# Patient Record
Sex: Female | Born: 1965
Health system: Southern US, Community
[De-identification: ages and names within clinical notes are randomized; demographics above are authoritative.]

## PROBLEM LIST (undated history)

## (undated) DIAGNOSIS — B029 Zoster without complications: Secondary | ICD-10-CM

## (undated) DIAGNOSIS — B019 Varicella without complication: Secondary | ICD-10-CM

## (undated) DIAGNOSIS — N950 Postmenopausal bleeding: Secondary | ICD-10-CM

## (undated) HISTORY — PX: JOINT REPLACEMENT: SHX530

## (undated) HISTORY — PX: BREAST ENHANCEMENT SURGERY: SHX7

## (undated) HISTORY — PX: AUGMENTATION MAMMAPLASTY: SUR837

## (undated) HISTORY — DX: Zoster without complications: B02.9

## (undated) HISTORY — DX: Postmenopausal bleeding: N95.0

## (undated) HISTORY — DX: Varicella without complication: B01.9

## (undated) HISTORY — PX: CHOLECYSTECTOMY: SHX55

---

## 2014-11-23 LAB — HM PAP SMEAR: HM PAP: NEGATIVE

## 2014-12-21 LAB — HM MAMMOGRAPHY

## 2014-12-28 ENCOUNTER — Ambulatory Visit (INDEPENDENT_AMBULATORY_CARE_PROVIDER_SITE_OTHER): Payer: BLUE CROSS/BLUE SHIELD | Admitting: Primary Care

## 2014-12-28 ENCOUNTER — Encounter: Payer: Self-pay | Admitting: Primary Care

## 2014-12-28 VITALS — BP 110/68 | HR 69 | Temp 98.1°F | Ht 68.0 in | Wt 129.8 lb

## 2014-12-28 DIAGNOSIS — G8929 Other chronic pain: Secondary | ICD-10-CM | POA: Insufficient documentation

## 2014-12-28 DIAGNOSIS — M25562 Pain in left knee: Secondary | ICD-10-CM | POA: Diagnosis not present

## 2014-12-28 DIAGNOSIS — M25569 Pain in unspecified knee: Secondary | ICD-10-CM

## 2014-12-28 NOTE — Assessment & Plan Note (Signed)
Intermittent starting 3 years ago. Crepitus on exam without decrease in ROM. Referral made to sports med per patient request. Continue tylenol PRN.

## 2014-12-28 NOTE — Progress Notes (Signed)
Pre visit review using our clinic review tool, if applicable. No additional management support is needed unless otherwise documented below in the visit note. 

## 2014-12-28 NOTE — Progress Notes (Signed)
Subjective:    Patient ID: Tara Parker, female    DOB: 08-14-1965, 49 y.o.   MRN: [id number]  HPI  [name] is a 49 year old female who presents today to establish care and discuss the problems mentioned below. Will obtain old records.  1) Knee pain: Pain and crepitus to left knee for the past 3 months. She's had this problem occur 3 years ago and visited a sports medicine physician in [place] who was helpful in providing exercises. She is intrersted in seeing our sports medicine physician for advice. She had xray's of her knee taken 6 weeks ago and has her results at home. She reports her xrays "looked good", but will bring them to her upcoming appointment with [name]. She takes occasional tylenol for pain with relief. She's been limiting her exercise to her local gym which has helped to reduce symptoms.   Review of Systems  Constitutional: Negative for fatigue and unexpected weight change.  HENT: Negative for rhinorrhea.   Respiratory: Negative for cough and shortness of breath.   Cardiovascular: Negative for chest pain.  Gastrointestinal: Negative for diarrhea and constipation.  Genitourinary: Negative for dysuria and frequency.  Musculoskeletal: Positive for arthralgias. Negative for myalgias.  Skin: Negative for rash.  Allergic/Immunologic: Negative for environmental allergies.  Neurological: Negative for dizziness and headaches.  Psychiatric/Behavioral:       Denies concerns for anxiety or depression.       Past Medical History  Diagnosis Date  . Chicken pox   . Shingles   . Postmenopausal vaginal bleeding     History   Social History  . Marital Status: Married    Spouse Name: N/A  . Number of Children: N/A  . Years of Education: N/A   Occupational History  . Not on file.   Social History Main Topics  . Smoking status: Never Smoker   . Smokeless tobacco: Not on file  . Alcohol Use: No  . Drug Use: Not on file  . Sexual Activity: Not on file    Other Topics Concern  . Not on file   Social History Narrative   Married.   1 child, age 35.   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   Enjoys reading, exercise, being outdoors.    Past Surgical History  Procedure Laterality Date  . Cholecystectomy      Family History  Problem Relation Age of Onset  . Heart disease Mother   . Cancer Maternal Grandmother 27    Uterine  . Heart attack Mother     Allergies  Allergen Reactions  . Penicillins     No current outpatient prescriptions on file prior to visit.   No current facility-administered medications on file prior to visit.    BP 110/68 mmHg  Pulse 69  Temp(Src) 98.1 F (36.7 C) (Oral)  Ht 5\' 8"  (1.727 m)  Wt 129 lb 12.8 oz (58.877 kg)  BMI 19.74 kg/m2  SpO2 98%  LMP 12/03/2014    Objective:   Physical Exam  Constitutional: She appears well-nourished.  Cardiovascular: Normal rate and regular rhythm.   Pulmonary/Chest: Effort normal and breath sounds normal.  Musculoskeletal:       Left knee: She exhibits normal range of motion and no swelling. No tenderness found.  Crepitus noted to left knee upon extension  Skin: Skin is warm and dry.          Assessment & Plan:

## 2014-12-28 NOTE — Patient Instructions (Signed)
You will be contacted regarding your referral to Sports Medicine.  Please let us know if you have not heard back within one week.   Please schedule a physical with me in October 2016. You will also schedule a lab only appointment one week prior. We will discuss your lab results during your physical.  It was a pleasure to meet you today! Please don't hesitate to call me with any questions. Welcome to [facility]!

## 2015-01-03 ENCOUNTER — Ambulatory Visit (INDEPENDENT_AMBULATORY_CARE_PROVIDER_SITE_OTHER): Payer: BLUE CROSS/BLUE SHIELD | Admitting: Family Medicine

## 2015-01-03 ENCOUNTER — Encounter: Payer: Self-pay | Admitting: Family Medicine

## 2015-01-03 VITALS — BP 90/60 | HR 72 | Temp 97.7°F | Ht 68.0 in | Wt 127.5 lb

## 2015-01-03 DIAGNOSIS — M25562 Pain in left knee: Secondary | ICD-10-CM

## 2015-01-03 DIAGNOSIS — M2240 Chondromalacia patellae, unspecified knee: Secondary | ICD-10-CM | POA: Diagnosis not present

## 2015-01-03 DIAGNOSIS — M25561 Pain in right knee: Secondary | ICD-10-CM

## 2015-01-03 NOTE — Progress Notes (Signed)
Pre visit review using our clinic review tool, if applicable. No additional management support is needed unless otherwise documented below in the visit note. 

## 2015-01-03 NOTE — Progress Notes (Signed)
Dr. Karleen Hampshire T. Shavona Gunderman, MD, CAQ Sports Medicine Primary Care and Sports Medicine [address] [place], [address] Phone: [phone] Fax: [phone]  01/03/2015  Patient: Tara Parker, MRN: [id number], DOB: [birth year], 49 y.o.  Primary Physician:  Morrie Sheldon, NP  Chief Complaint: Knee Pain  Subjective:   Tara Parker is a 49 y.o. very pleasant female patient who presents with the following:  Consulting Provider: Mayra Reel, NP Reason: B Knee pain  Very pleasant, lifelong active patient who is an avid weightlifter.  She is here for some advice about what she can do for her knees and what she cannot do.  She saw a provider in [place] a number of months ago, and she was told that she had chondromalacia patella.  She was given some advice, and she was told that she should no longer squat, deadlift, lunge or do any other explosive movements.  She wanted to get my advised about whether or not she could adapter workouts and start including some of these more traditional lifts.  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  She has never had any previous knee surgery.  She is not having any effusions.  She is not having any mechanical symptoms.  Some pain going up and down stairs and will be a little achy.  Left is a little worse.   Past Medical History, Surgical History, Social History, Family History, Problem List, Medications, and Allergies have been reviewed and updated if relevant.  Patient Active Problem List   Diagnosis Date Noted  . Knee pain, chronic 12/28/2014    Past Medical History  Diagnosis Date  . Chicken pox   . Shingles   . Postmenopausal vaginal bleeding     Past Surgical History  Procedure Laterality Date  . Cholecystectomy      History   Social History  . Marital Status: Married    Spouse Name: N/A  . Number of Children: N/A  . Years of Education: N/A   Occupational History  . Not on file.   Social  History Main Topics  . Smoking status: Never Smoker   . Smokeless tobacco: Never Used  . Alcohol Use: No  . Drug Use: Not on file  . Sexual Activity: Not on file   Other Topics Concern  . Not on file   Social History Narrative   Married.   1 child, age 71.   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   Enjoys reading, exercise, being outdoors.    Family History  Problem Relation Age of Onset  . Heart disease Mother   . Cancer Maternal Grandmother 27    Uterine  . Heart attack Mother     Allergies  Allergen Reactions  . Penicillins     Medication list reviewed and updated in full in [facility].  GEN: No fevers, chills. Nontoxic. Primarily MSK c/o today. MSK: Detailed in the HPI GI: tolerating PO intake without difficulty Neuro: No numbness, parasthesias, or tingling associated. Otherwise the pertinent positives of the ROS are noted above.   Objective:   BP 90/60 mmHg  Pulse 72  Temp(Src) 97.7 F (36.5 C) (Oral)  Ht 5\' 8"  (1.727 m)  Wt 127 lb 8 oz (57.834 kg)  BMI 19.39 kg/m2  LMP 12/03/2014   GEN: WDWN, NAD, Non-toxic, Alert & Oriented x 3 HEENT: Atraumatic, Normocephalic.  Ears and Nose: No external deformity. EXTR:  No clubbing/cyanosis/edema NEURO: Normal gait.  PSYCH: Normally interactive. Conversant. Not depressed or anxious appearing.  Calm demeanor.   Knee:  B Gait: Normal heel toe pattern ROM: 0-135 Effusion: neg Echymosis or edema: none Patellar tendon NT Painful PLICA: neg Patellar grind: + B Medial and lateral patellar facet loading: negative medial and lateral joint lines:NT Mcmurray's neg Flexion-pinch neg Varus and valgus stress: stable Lachman: neg Ant and Post drawer: neg Hip abduction, IR, ER: WNL Hip flexion str: 5/5 Hip abd: 5/5 Quad: 5/5 VMO atrophy:No Hamstring concentric and eccentric: 5/5   Radiology: Bilateral AP, lateral, and Rosenberg weightbearing films.  Bilateral  sunrise. Independent interpretation from outside films.  These are reproductions on paper of high quality, but original films are not available.  There are some mild changes of patellar osteophytosis present.  Joint spaces are preserved medially and laterally.  Apparently normal tracking on sunrise view. Hannah Beat, MD   Assessment and Plan:   Chondropathia patellae, unspecified laterality  Knee pain, bilateral  The patient's knees move well.  She does have some patellar crepitus.  She notices this when she is trying to squat and she is doing the deadlift, but she is not really having any pain when doing these. Leg extensions have been causing her some pain, as well as lunges.  The patient's joint space is effectively preserved with some PF changes.  She has fantastic motion for age.  Crepitus at age 49 is basically normal.  If she hears crepitus while she is doing any compound movement, this is really not all that important.  Recommended that she get some 9 mm compression knee sleeves that are used for powerlifting.  I certainly have no problem with her doing squats or deadlifts.  She may have to adjust her depth with doing squats.  Lunges may be more difficult, but I think that she can also adjust her depth, and some people are able to do a reverse lunge more easily. Recommended doing some specific VMO work, too. Try a longer warm-up.  I appreciate the opportunity to evaluate this very friendly patient. If you have any question regarding her care or prognosis, do not hesitate to ask.   Follow-up: prn  Patient Instructions  Deadlifts fine Squats fine - keep parallell - slightly above if needed  Specifics: Work on [employer] work (teardrop muscle near knee) Leg extensions with modification without taking to 90. Place small med ball - squeeze between legs to help engage VMO.       Signed,  Elpidio Galea. Ardell Aaronson, MD   Patient's Medications  New Prescriptions   No medications on file    Previous Medications   ASCORBIC ACID (VITAMIN C) 1000 MG TABLET    Take 1,000 mg by mouth daily.   ESTRADIOL (CLIMARA - DOSED IN MG/24 HR) 0.075 MG/24HR PATCH    Place 0.075 mg onto the skin 2 (two) times a week.    MULTIPLE VITAMIN (MULTIVITAMIN) CAPSULE    Take 1 capsule by mouth daily.   PROGESTERONE (PROMETRIUM) 100 MG CAPSULE    Take 100 mg by mouth. Days 14 thru 25 every month  Modified Medications   No medications on file  Discontinued Medications   No medications on file

## 2015-01-03 NOTE — Patient Instructions (Signed)
Deadlifts fine Squats fine - keep parallell - slightly above if needed  Specifics: Work on [employer] work (teardrop muscle near knee) Leg extensions with modification without taking to 90. Place small med ball - squeeze between legs to help engage VMO.

## 2015-04-17 ENCOUNTER — Other Ambulatory Visit: Payer: Self-pay | Admitting: Primary Care

## 2015-04-17 DIAGNOSIS — Z0000 Encounter for general adult medical examination without abnormal findings: Secondary | ICD-10-CM

## 2015-04-23 ENCOUNTER — Other Ambulatory Visit (INDEPENDENT_AMBULATORY_CARE_PROVIDER_SITE_OTHER): Payer: BLUE CROSS/BLUE SHIELD

## 2015-04-23 DIAGNOSIS — Z0000 Encounter for general adult medical examination without abnormal findings: Secondary | ICD-10-CM

## 2015-04-23 LAB — COMPREHENSIVE METABOLIC PANEL
ALBUMIN: 4.5 g/dL (ref 3.5–5.2)
ALK PHOS: 29 U/L — AB (ref 39–117)
ALT: 20 U/L (ref 0–35)
AST: 26 U/L (ref 0–37)
BUN: 16 mg/dL (ref 6–23)
CALCIUM: 9.8 mg/dL (ref 8.4–10.5)
CO2: 30 mEq/L (ref 19–32)
Chloride: 103 mEq/L (ref 96–112)
Creatinine, Ser: 0.89 mg/dL (ref 0.40–1.20)
GFR: 71.57 mL/min (ref >60.00)
Glucose, Bld: 88 mg/dL (ref 70–99)
Potassium: 4.2 mEq/L (ref 3.5–5.1)
Sodium: 142 mEq/L (ref 135–145)
TOTAL PROTEIN: 7.1 g/dL (ref 6.0–8.3)
Total Bilirubin: 0.9 mg/dL (ref 0.2–1.2)

## 2015-04-23 LAB — LIPID PANEL
CHOLESTEROL: 174 mg/dL (ref 0–200)
HDL: 62.2 mg/dL (ref >39.00)
LDL Cholesterol: 101 mg/dL — ABNORMAL HIGH (ref 0–99)
NonHDL: 111.62
TRIGLYCERIDES: 55 mg/dL (ref 0.0–149.0)
Total CHOL/HDL Ratio: 3
VLDL: 11 mg/dL (ref 0.0–40.0)

## 2015-04-23 LAB — VITAMIN D 25 HYDROXY (VIT D DEFICIENCY, FRACTURES): VITD: 40.7 ng/mL (ref 30.00–100.00)

## 2015-04-25 ENCOUNTER — Encounter: Payer: BLUE CROSS/BLUE SHIELD | Admitting: Primary Care

## 2015-05-04 ENCOUNTER — Ambulatory Visit (INDEPENDENT_AMBULATORY_CARE_PROVIDER_SITE_OTHER): Payer: BLUE CROSS/BLUE SHIELD | Admitting: Primary Care

## 2015-05-04 ENCOUNTER — Encounter: Payer: Self-pay | Admitting: Primary Care

## 2015-05-04 VITALS — BP 92/64 | HR 70 | Temp 98.2°F | Ht 68.0 in | Wt 126.8 lb

## 2015-05-04 DIAGNOSIS — L989 Disorder of the skin and subcutaneous tissue, unspecified: Secondary | ICD-10-CM | POA: Diagnosis not present

## 2015-05-04 DIAGNOSIS — Z23 Encounter for immunization: Secondary | ICD-10-CM

## 2015-05-04 DIAGNOSIS — R238 Other skin changes: Secondary | ICD-10-CM

## 2015-05-04 DIAGNOSIS — Z0000 Encounter for general adult medical examination without abnormal findings: Secondary | ICD-10-CM

## 2015-05-04 NOTE — Assessment & Plan Note (Signed)
Tetanus, pap, and mammogram UTD, flu provided today. Exam and labs unremarkable. She leads a healthy lifestyle through healthy diet and exercise. History of shingles, she will check on insurance coverage for the vaccination. Referral placed to dermatology for annual body checks as she was getting this in [place] Follow up in 1 year for repeat physical.

## 2015-05-04 NOTE — Patient Instructions (Addendum)
Please contact your insurance company regarding coverage for the Zostavax (shingles) vaccination.  Continue your efforts towards a healthy lifestyle.  You will be contacted regarding your referral to Dermatology.  Please let us know if you have not heard back within one week.   Follow up in 1 year for repeat physical.   It was a pleasure to see you today!

## 2015-05-04 NOTE — Progress Notes (Signed)
Pre visit review using our clinic review tool, if applicable. No additional management support is needed unless otherwise documented below in the visit note. 

## 2015-05-04 NOTE — Progress Notes (Signed)
Subjective:    Patient ID: Tara Parker, female    DOB: [birth year], 49 y.o.   MRN: [id number]  HPI  Tara Parker is a 49 year old female who presents today for complete physical.  Immunizations: -Tetanus: Completed 8-9 years ago.  -Influenza: Due, will administer today.   Diet: Endorses healthy diet. Breakfast: Omlette, waffles, oatmeal Lunch: Lean meat, veggies, sweet potatoes Dinner: Chief Operating Officer, veggie, starch Snack: Yogurt, protein shake Desserts: Occasionally ice cream Beverages: Water, coffee, un-sweet tea, crystal light Exercise: She exercises 4 times weekly at the gym for about 1 hour. Eye exam: Completed 1 year ago, due this year. Dental exam: Completes every 6 months. Pap Smear: Completed in 2016 per GYN. Mammogram: Mammogram completed in 2016.   Review of Systems  Constitutional: Negative for unexpected weight change.  HENT: Negative for rhinorrhea.   Respiratory: Negative for cough and shortness of breath.   Cardiovascular: Negative for chest pain.  Gastrointestinal: Negative for diarrhea and constipation.  Genitourinary: Negative for difficulty urinating.       Last period 5 years ago.  Musculoskeletal: Negative for myalgias and arthralgias.  Skin: Negative for rash.       She is requesting dermatologist referral.  Neurological: Negative for dizziness, numbness and headaches.  Psychiatric/Behavioral:       Denies concerns for anxiety or depression       Past Medical History  Diagnosis Date  . Chicken pox   . Shingles   . Postmenopausal vaginal bleeding     Social History   Social History  . Marital Status: Married    Spouse Name: N/A  . Number of Children: N/A  . Years of Education: N/A   Occupational History  . Not on file.   Social History Main Topics  . Smoking status: Never Smoker   . Smokeless tobacco: Never Used  . Alcohol Use: No  . Drug Use: Not on file  . Sexual Activity: Not on file   Other Topics Concern  . Not on file    Social History Narrative   Married.   1 child, age 55.   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   Enjoys reading, exercise, being outdoors.    Past Surgical History  Procedure Laterality Date  . Cholecystectomy      Family History  Problem Relation Age of Onset  . Heart disease Mother   . Cancer Maternal Grandmother 27    Uterine  . Heart attack Mother     Allergies  Allergen Reactions  . Penicillins     Current Outpatient Prescriptions on File Prior to Visit  Medication Sig Dispense Refill  . Ascorbic Acid (VITAMIN C) 1000 MG tablet Take 1,000 mg by mouth daily.    Marland Kitchen estradiol (CLIMARA - DOSED IN MG/24 HR) 0.075 mg/24hr patch Place 0.075 mg onto the skin 2 (two) times a week.     . Multiple Vitamin (MULTIVITAMIN) capsule Take 1 capsule by mouth daily.    . progesterone (PROMETRIUM) 100 MG capsule Take 100 mg by mouth. Days 14 thru 25 every month     No current facility-administered medications on file prior to visit.    BP 92/64 mmHg  Pulse 70  Temp(Src) 98.2 F (36.8 C) (Oral)  Ht  (1.727 m)  Wt 126 lb 12.8 oz (57.516 kg)  BMI 19.28 kg/m2  SpO2 97%  LMP 12/03/2014    Objective:   Physical Exam  Constitutional:  She is oriented to person, place, and time. She appears well-nourished.  HENT:  Right Ear: Tympanic membrane and ear canal normal.  Left Ear: Tympanic membrane and ear canal normal.  Nose: Nose normal.  Mouth/Throat: Oropharynx is clear and moist.  Eyes: Conjunctivae and EOM are normal. Pupils are equal, round, and reactive to light.  Neck: Neck supple. No thyromegaly present.  Cardiovascular: Normal rate and regular rhythm.   Pulmonary/Chest: Effort normal and breath sounds normal.  Abdominal: Soft. Bowel sounds are normal. There is no tenderness.  Musculoskeletal: Normal range of motion.  Lymphadenopathy:    She has no cervical adenopathy.  Neurological: She is alert and oriented to  person, place, and time. She has normal reflexes. No cranial nerve deficit.  Skin: Skin is warm and dry.  Psychiatric: She has a normal mood and affect.          Assessment & Plan:

## 2015-05-04 NOTE — Addendum Note (Signed)
Addended by: Tawnya CrookSAMBATH, Rondale Nies on: 05/04/2015 11:01 AM   Modules accepted: Orders

## 2016-01-03 DIAGNOSIS — N951 Menopausal and female climacteric states: Secondary | ICD-10-CM | POA: Diagnosis not present

## 2016-01-03 DIAGNOSIS — Z01419 Encounter for gynecological examination (general) (routine) without abnormal findings: Secondary | ICD-10-CM | POA: Diagnosis not present

## 2016-01-03 DIAGNOSIS — Z6821 Body mass index (BMI) 21.0-21.9, adult: Secondary | ICD-10-CM | POA: Diagnosis not present

## 2016-04-03 DIAGNOSIS — M62830 Muscle spasm of back: Secondary | ICD-10-CM | POA: Diagnosis not present

## 2016-04-03 DIAGNOSIS — M9902 Segmental and somatic dysfunction of thoracic region: Secondary | ICD-10-CM | POA: Diagnosis not present

## 2016-04-03 DIAGNOSIS — R51 Headache: Secondary | ICD-10-CM | POA: Diagnosis not present

## 2016-04-03 DIAGNOSIS — M9901 Segmental and somatic dysfunction of cervical region: Secondary | ICD-10-CM | POA: Diagnosis not present

## 2016-06-10 ENCOUNTER — Encounter: Payer: Self-pay | Admitting: Primary Care

## 2016-06-10 ENCOUNTER — Ambulatory Visit (INDEPENDENT_AMBULATORY_CARE_PROVIDER_SITE_OTHER): Payer: BLUE CROSS/BLUE SHIELD | Admitting: Primary Care

## 2016-06-10 VITALS — BP 108/70 | HR 73 | Temp 98.0°F | Ht 68.0 in | Wt 129.0 lb

## 2016-06-10 DIAGNOSIS — R21 Rash and other nonspecific skin eruption: Secondary | ICD-10-CM | POA: Diagnosis not present

## 2016-06-10 DIAGNOSIS — Z23 Encounter for immunization: Secondary | ICD-10-CM | POA: Diagnosis not present

## 2016-06-10 NOTE — Progress Notes (Signed)
Pre visit review using our clinic review tool, if applicable. No additional management support is needed unless otherwise documented below in the visit note. 

## 2016-06-10 NOTE — Patient Instructions (Signed)
Stop taking the antihistamine to see if the rash flares again.   If the rash flares again, then start taking the antihistamine every day for the next 4 weeks.  Please e-mail me through my chart if no improvement at that time.  It was a pleasure to see you today!

## 2016-06-10 NOTE — Progress Notes (Signed)
Subjective:    Patient ID: Tara Parker, female    DOB: Dec 15, 1965, 50 y.o.   MRN: [id number]  HPI  Tara Parker is a 50 year old female who presents today with a chief complaint of rash. Her rash is located to the posterior trunk that has been intermittent since late September 2017. Tara Parker has noticed the rash to her bilateral upper extremities as well.   Tara Parker has has been under increased stress as Tara Parker's lost a job and there's been a death in the family. Tara Parker denies changes in detergents, soaps, food, medications. Tara Parker also denies shortness of breath, tightness to her chest and throat, wheezing. Tara Parker's been taking an OTC antihistamine twice daily intermittently starting at the end of October with temporary improvement. Her last flare was October 31st, but Tara Parker started noticing signs of a flare today.  Review of Systems  Respiratory: Negative for chest tightness and shortness of breath.   Skin: Positive for rash.  Allergic/Immunologic: Negative for environmental allergies and food allergies.       Past Medical History:  Diagnosis Date  . Chicken pox   . Postmenopausal vaginal bleeding   . Shingles      Social History   Social History  . Marital status: Married    Spouse name: N/A  . Number of children: N/A  . Years of education: N/A   Occupational History  . Not on file.   Social History Main Topics  . Smoking status: Never Smoker  . Smokeless tobacco: Never Used  . Alcohol use No  . Drug use: Unknown  . Sexual activity: Not on file   Other Topics Concern  . Not on file   Social History Narrative   Married.   1 child, age 50.   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   Enjoys reading, exercise, being outdoors.    Past Surgical History:  Procedure Laterality Date  . CHOLECYSTECTOMY      Family History  Problem Relation Age of Onset  . Heart disease Mother   . Cancer Maternal Grandmother 27    Uterine  . Heart  attack Mother     Allergies  Allergen Reactions  . Penicillins     Current Outpatient Prescriptions on File Prior to Visit  Medication Sig Dispense Refill  . Ascorbic Acid (VITAMIN C) 1000 MG tablet Take 1,000 mg by mouth daily.    . Multiple Vitamin (MULTIVITAMIN) capsule Take 1 capsule by mouth daily.    . progesterone (PROMETRIUM) 100 MG capsule Take 100 mg by mouth. Days 14 thru 25 every month     No current facility-administered medications on file prior to visit.     BP 108/70   Pulse 73   Temp 98 F (36.7 C)   Ht 5\' 8"  (1.727 m)   Wt 129 lb (58.5 kg)   LMP 12/03/2014   SpO2 98%   BMI 19.61 kg/m    Objective:   Physical Exam  Constitutional: Tara Parker appears well-nourished.  Neck: Neck supple.  Cardiovascular: Normal rate and regular rhythm.   Pulmonary/Chest: Effort normal and breath sounds normal.  Skin: Skin is warm and dry.  Very mild, small, flat area of erythema to left anterior upper forearm distal to antecubital fold.          Assessment & Plan:  Rash:  Intermittently present since late September. Tara Parker brings a picture today with the full blown rash to her posterior  trunk which appears to be macular rash with moderate erythema.  Unsure of etiology, but suspect this is stress induced. Does not appear to be contact dermatitis. Will have her start the antihistamine daily for the next 4 weeks. If no improvement in 1-2 weeks then will add in famotidine 20 mg once daily. Discussed stress reduction techniques.  Tara Parker,Tara Lassalle Kendal, NP

## 2016-06-24 ENCOUNTER — Encounter: Payer: Self-pay | Admitting: Primary Care

## 2016-07-24 DIAGNOSIS — D0439 Carcinoma in situ of skin of other parts of face: Secondary | ICD-10-CM | POA: Diagnosis not present

## 2016-07-24 DIAGNOSIS — D1801 Hemangioma of skin and subcutaneous tissue: Secondary | ICD-10-CM | POA: Diagnosis not present

## 2016-07-24 DIAGNOSIS — D485 Neoplasm of uncertain behavior of skin: Secondary | ICD-10-CM | POA: Diagnosis not present

## 2016-07-24 DIAGNOSIS — C44612 Basal cell carcinoma of skin of right upper limb, including shoulder: Secondary | ICD-10-CM | POA: Diagnosis not present

## 2016-08-14 DIAGNOSIS — L578 Other skin changes due to chronic exposure to nonionizing radiation: Secondary | ICD-10-CM | POA: Diagnosis not present

## 2016-08-14 DIAGNOSIS — L908 Other atrophic disorders of skin: Secondary | ICD-10-CM | POA: Diagnosis not present

## 2016-08-14 DIAGNOSIS — L814 Other melanin hyperpigmentation: Secondary | ICD-10-CM | POA: Diagnosis not present

## 2016-08-14 DIAGNOSIS — D0439 Carcinoma in situ of skin of other parts of face: Secondary | ICD-10-CM | POA: Diagnosis not present

## 2016-08-21 DIAGNOSIS — M9901 Segmental and somatic dysfunction of cervical region: Secondary | ICD-10-CM | POA: Diagnosis not present

## 2016-08-21 DIAGNOSIS — M9902 Segmental and somatic dysfunction of thoracic region: Secondary | ICD-10-CM | POA: Diagnosis not present

## 2016-08-21 DIAGNOSIS — M62830 Muscle spasm of back: Secondary | ICD-10-CM | POA: Diagnosis not present

## 2016-08-21 DIAGNOSIS — R51 Headache: Secondary | ICD-10-CM | POA: Diagnosis not present

## 2016-08-22 DIAGNOSIS — H52203 Unspecified astigmatism, bilateral: Secondary | ICD-10-CM | POA: Diagnosis not present

## 2016-08-22 DIAGNOSIS — H2513 Age-related nuclear cataract, bilateral: Secondary | ICD-10-CM | POA: Diagnosis not present

## 2016-08-22 DIAGNOSIS — H524 Presbyopia: Secondary | ICD-10-CM | POA: Diagnosis not present

## 2016-08-22 DIAGNOSIS — H5203 Hypermetropia, bilateral: Secondary | ICD-10-CM | POA: Diagnosis not present

## 2016-09-05 DIAGNOSIS — C44519 Basal cell carcinoma of skin of other part of trunk: Secondary | ICD-10-CM | POA: Diagnosis not present

## 2016-09-05 DIAGNOSIS — C44612 Basal cell carcinoma of skin of right upper limb, including shoulder: Secondary | ICD-10-CM | POA: Diagnosis not present

## 2016-10-19 LAB — HM MAMMOGRAPHY

## 2016-10-24 ENCOUNTER — Encounter: Payer: Self-pay | Admitting: Primary Care

## 2016-10-29 ENCOUNTER — Encounter: Payer: Self-pay | Admitting: Family Medicine

## 2016-10-29 ENCOUNTER — Ambulatory Visit (INDEPENDENT_AMBULATORY_CARE_PROVIDER_SITE_OTHER): Payer: BLUE CROSS/BLUE SHIELD | Admitting: Family Medicine

## 2016-10-29 VITALS — BP 98/68 | HR 75 | Temp 98.5°F | Wt 130.0 lb

## 2016-10-29 DIAGNOSIS — N3090 Cystitis, unspecified without hematuria: Secondary | ICD-10-CM | POA: Diagnosis not present

## 2016-10-29 DIAGNOSIS — R300 Dysuria: Secondary | ICD-10-CM

## 2016-10-29 LAB — POCT URINALYSIS DIPSTICK
Bilirubin, UA: NEGATIVE
GLUCOSE UA: NEGATIVE
Ketones, UA: NEGATIVE
NITRITE UA: NEGATIVE
PROTEIN UA: NEGATIVE
Spec Grav, UA: 1.01 (ref 1.010–1.025)
Urobilinogen, UA: 0.2 E.U./dL
pH, UA: 6.5 (ref 5.0–8.0)

## 2016-10-29 MED ORDER — NITROFURANTOIN MONOHYD MACRO 100 MG PO CAPS: 100.0000 mg | ORAL_CAPSULE | Freq: Two times a day (BID) | ORAL | 0 refills | Status: DC

## 2016-10-29 NOTE — Progress Notes (Signed)
Pre visit review using our clinic review tool, if applicable. No additional management support is needed unless otherwise documented below in the visit note. 

## 2016-10-29 NOTE — Patient Instructions (Signed)

## 2016-10-29 NOTE — Progress Notes (Signed)
Subjective:    Patient ID: Tara Parker, female    DOB: May 17, 1966, 51 y.o.   MRN: [id number]  HPI This is a 51 yo female who presents today with 2 days of dysuria, increased frequency, sensation of incomplete voiding. A little lower abdominal pressure, mild low back pain. Felt a little chilly last night, resolved spontaneously. No shaking chills. No nausea or vomiting. Last week did three spin classes which is more than she typically does. Did stay in work out clothes for several hours. Last UTI was when she was in her 49s. She is postmenopausal, takes estrogen and progesterone. Good water intake.    Past Medical History:  Diagnosis Date  . Chicken pox   . Postmenopausal vaginal bleeding   . Shingles    Past Surgical History:  Procedure Laterality Date  . CHOLECYSTECTOMY     Family History  Problem Relation Age of Onset  . Heart disease Mother   . Cancer Maternal Grandmother 27    Uterine  . Heart attack Mother    Social History  Substance Use Topics  . Smoking status: Never Smoker  . Smokeless tobacco: Never Used  . Alcohol use No      Review of Systems Per HPI    Objective:   Physical Exam  Constitutional: She is oriented to person, place, and time. She appears well-developed and well-nourished. No distress.  HENT:  Head: Normocephalic and atraumatic.  Eyes: Conjunctivae are normal.  Cardiovascular: Normal rate, regular rhythm and normal heart sounds.   Pulmonary/Chest: Effort normal and breath sounds normal.  Abdominal: Soft. Bowel sounds are normal. She exhibits no distension. There is tenderness in the suprapubic area. There is no rebound, no guarding and no CVA tenderness.  Neurological: She is alert and oriented to person, place, and time.  Skin: Skin is warm and dry. She is not diaphoretic.  Psychiatric: She has a normal mood and affect. Her behavior is normal. Judgment and thought content normal.  Vitals reviewed.     BP 98/68 (BP Location: Left  Arm, Patient Position: Sitting, Cuff Size: Normal)   Pulse 75   Temp 98.5 F (36.9 C) (Oral)   Wt 130 lb (59 kg)   LMP 12/03/2014   SpO2 98%   BMI 19.77 kg/m  Wt Readings from Last 3 Encounters:  10/29/16 130 lb (59 kg)  06/10/16 129 lb (58.5 kg)  05/04/15 126 lb 12.8 oz (57.5 kg)   Results for orders placed or performed in visit on 10/29/16  POCT urinalysis dipstick  Result Value Ref Range   Color, UA yellow    Clarity, UA clear    Glucose, UA neg    Bilirubin, UA neg    Ketones, UA neg    Spec Grav, UA 1.010 1.010 - 1.025   Blood, UA 10 Ery/uL    pH, UA 6.5 5.0 - 8.0   Protein, UA neg    Urobilinogen, UA 0.2 0.2 or 1.0 E.U./dL   Nitrite, UA neg    Leukocytes, UA small (1+) small (1+)       Assessment & Plan:  1. Dysuria - POCT urinalysis dipstick  2. Cystitis - Provided written and verbal information regarding diagnosis and treatment. - RTC precautions reviewed - nitrofurantoin, macrocrystal-monohydrate, (MACROBID) 100 MG capsule; Take 1 capsule (100 mg total) by mouth 2 (two) times daily.  Dispense: 14 capsule; Refill: 0 - Urine culture   Olean Ree, FNP-BC  [facility]  10/29/2016  11:23 AM

## 2016-10-31 LAB — URINE CULTURE

## 2016-11-26 DIAGNOSIS — L578 Other skin changes due to chronic exposure to nonionizing radiation: Secondary | ICD-10-CM | POA: Diagnosis not present

## 2016-11-26 DIAGNOSIS — D0439 Carcinoma in situ of skin of other parts of face: Secondary | ICD-10-CM | POA: Diagnosis not present

## 2017-01-02 DIAGNOSIS — Z85828 Personal history of other malignant neoplasm of skin: Secondary | ICD-10-CM | POA: Diagnosis not present

## 2017-02-02 ENCOUNTER — Other Ambulatory Visit: Payer: Self-pay | Admitting: Primary Care

## 2017-02-02 DIAGNOSIS — Z0000 Encounter for general adult medical examination without abnormal findings: Secondary | ICD-10-CM

## 2017-02-06 ENCOUNTER — Other Ambulatory Visit (INDEPENDENT_AMBULATORY_CARE_PROVIDER_SITE_OTHER): Payer: BLUE CROSS/BLUE SHIELD

## 2017-02-06 ENCOUNTER — Other Ambulatory Visit: Payer: Self-pay | Admitting: Primary Care

## 2017-02-06 DIAGNOSIS — Z0000 Encounter for general adult medical examination without abnormal findings: Secondary | ICD-10-CM | POA: Diagnosis not present

## 2017-02-06 LAB — COMPREHENSIVE METABOLIC PANEL
ALT: 25 U/L (ref 0–35)
AST: 31 U/L (ref 0–37)
Albumin: 4.8 g/dL (ref 3.5–5.2)
Alkaline Phosphatase: 27 U/L — ABNORMAL LOW (ref 39–117)
BUN: 23 mg/dL (ref 6–23)
CHLORIDE: 102 meq/L (ref 96–112)
CO2: 32 meq/L (ref 19–32)
CREATININE: 0.98 mg/dL (ref 0.40–1.20)
Calcium: 9.8 mg/dL (ref 8.4–10.5)
GFR: 63.58 mL/min (ref >60.00)
GLUCOSE: 94 mg/dL (ref 70–99)
POTASSIUM: 4 meq/L (ref 3.5–5.1)
SODIUM: 139 meq/L (ref 135–145)
Total Bilirubin: 1.1 mg/dL (ref 0.2–1.2)
Total Protein: 7.1 g/dL (ref 6.0–8.3)

## 2017-02-06 LAB — LIPID PANEL
Cholesterol: 174 mg/dL (ref 0–200)
HDL: 58.9 mg/dL (ref >39.00)
LDL Cholesterol: 105 mg/dL — ABNORMAL HIGH (ref 0–99)
NonHDL: 115
Total CHOL/HDL Ratio: 3
Triglycerides: 52 mg/dL (ref 0.0–149.0)
VLDL: 10.4 mg/dL (ref 0.0–40.0)

## 2017-02-06 NOTE — Telephone Encounter (Signed)
Please notify patient that we will discuss this during her upcoming visit.

## 2017-02-06 NOTE — Telephone Encounter (Signed)
When pt came in for labs, she asked about a refill for Estradiol 1mg . [place] and she says she can't get a refill from them unless she goes back for a visit.

## 2017-02-09 NOTE — Telephone Encounter (Signed)
Message left for patient to return my call.  

## 2017-02-10 ENCOUNTER — Encounter: Payer: Self-pay | Admitting: Primary Care

## 2017-02-10 NOTE — Telephone Encounter (Deleted)
Spoken and notified patient of Kate's comments. Patient verbalized understanding. 

## 2017-02-10 NOTE — Telephone Encounter (Signed)
Message left for patient to return my call.  

## 2017-02-13 ENCOUNTER — Other Ambulatory Visit: Payer: Self-pay | Admitting: Primary Care

## 2017-02-13 ENCOUNTER — Encounter: Payer: Self-pay | Admitting: Primary Care

## 2017-02-13 ENCOUNTER — Ambulatory Visit (INDEPENDENT_AMBULATORY_CARE_PROVIDER_SITE_OTHER): Payer: BLUE CROSS/BLUE SHIELD | Admitting: Primary Care

## 2017-02-13 VITALS — Ht 68.0 in | Wt 130.4 lb

## 2017-02-13 DIAGNOSIS — G479 Sleep disorder, unspecified: Secondary | ICD-10-CM | POA: Diagnosis not present

## 2017-02-13 DIAGNOSIS — Z79890 Hormone replacement therapy (postmenopausal): Secondary | ICD-10-CM

## 2017-02-13 DIAGNOSIS — Z23 Encounter for immunization: Secondary | ICD-10-CM

## 2017-02-13 DIAGNOSIS — Z1211 Encounter for screening for malignant neoplasm of colon: Secondary | ICD-10-CM

## 2017-02-13 DIAGNOSIS — Z0000 Encounter for general adult medical examination without abnormal findings: Secondary | ICD-10-CM | POA: Diagnosis not present

## 2017-02-13 MED ORDER — PROGESTERONE MICRONIZED 100 MG PO CAPS: ORAL_CAPSULE | ORAL | 1 refills | Status: DC

## 2017-02-13 NOTE — Patient Instructions (Addendum)
You will be contacted regarding your referral to GI for the colonoscopy, pulmonology for sleep study, GYN for hormone replacement.  Please let us know if you have not heard back within one week.   I sent a refill of your Prometrium to the pharmacy. Please discuss hormone replacement with GYN.  Continue exercising. You should be getting 150 minutes of moderate intensity exercise weekly.  You were provided with a tetanus vaccination which will cover you for 10 years.  Follow up in 1 year for your annual exam or sooner if needed.  It was a pleasure to see you today!

## 2017-02-13 NOTE — Assessment & Plan Note (Signed)
TD due, provided today. Mammogram UTD, Pap UTD. Referral placed to GYN for management of hormones. Colonoscopy due, referral placed. Referral placed to pulmonology for apnea during sleep. Commended her on her healthy diet and regular exercise. Exam unremarkable. Labs unremarkable. Follow up in 1 year for annual exam.

## 2017-02-13 NOTE — Telephone Encounter (Signed)
This was addressed in office visit on 02/13/2017

## 2017-02-13 NOTE — Addendum Note (Signed)
Addended by: Tawnya CrookSAMBATH, Derreon Consalvo on: 02/13/2017 10:23 AM   Modules accepted: Orders

## 2017-02-13 NOTE — Progress Notes (Signed)
Subjective:    Patient ID: Tara Parker, female    DOB: 08-17-1965, 51 y.o.   MRN: [id number]  HPI  Tara Parker is a 51 year old female who presents today for complete physical.  Immunizations: -Tetanus: Due.  -Influenza: Completed last season   Diet: She endorses healthy diet. Breakfast: Omelette, oatmeal Lunch: Meat, vegetables Dinner: Meat, vegetable Snacks: Protein shake, protein bar, fruit Desserts: Once weekly Beverages: Coffee, water, occasional crystal, social alcohol   Exercise: Exercises with cardio three mornings weekly (20-30) minutes. Eye exam: Completed in 2018 Dental exam: Completes annually Colonoscopy: Due.  Pap Smear: Completed in 2016 per GYN.  Mammogram: Completed in April 2018   Review of Systems  Constitutional: Negative for unexpected weight change.  HENT: Negative for rhinorrhea.   Respiratory: Negative for cough and shortness of breath.        Husband has noticed patient stops breathing during the night. She will wake 1-2 times weekly gasping for air. No snoring.  Cardiovascular: Negative for chest pain.  Gastrointestinal: Negative for constipation and diarrhea.  Genitourinary: Negative for difficulty urinating and menstrual problem.  Musculoskeletal: Negative for arthralgias and myalgias.  Skin: Negative for rash.  Allergic/Immunologic: Negative for environmental allergies.  Neurological: Negative for dizziness, numbness and headaches.  Psychiatric/Behavioral:       Denies concerns for anxiety or depression       Past Medical History:  Diagnosis Date  . Chicken pox   . Postmenopausal vaginal bleeding   . Shingles      Social History   Social History  . Marital status: Married    Spouse name: N/A  . Number of children: N/A  . Years of education: N/A   Occupational History  . Not on file.   Social History Main Topics  . Smoking status: Never Smoker  . Smokeless tobacco: Never Used  . Alcohol use No  . Drug use: Unknown    . Sexual activity: Not on file   Other Topics Concern  . Not on file   Social History Narrative   Married.   1 child, age 226.   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   Enjoys reading, exercise, being outdoors.    Past Surgical History:  Procedure Laterality Date  . CHOLECYSTECTOMY      Family History  Problem Relation Age of Onset  . Heart disease Mother   . Cancer Maternal Grandmother 27       Uterine  . Heart attack Mother     Allergies  Allergen Reactions  . Penicillins     Current Outpatient Prescriptions on File Prior to Visit  Medication Sig Dispense Refill  . Ascorbic Acid (VITAMIN C) 1000 MG tablet Take 1,000 mg by mouth daily.    Marland Kitchen. estradiol (ESTRACE) 1 MG tablet TAKE 1 T BY MOUTH DAILY AS DIRECTED  3  . fluorouracil (EFUDEX) 5 % cream Apply topically daily.   0  . Multiple Vitamin (MULTIVITAMIN) capsule Take 1 capsule by mouth daily.    . progesterone (PROMETRIUM) 100 MG capsule Take 100 mg by mouth. Days 14 thru 25 every month     No current facility-administered medications on file prior to visit.     Ht 5\' 8"  (1.727 m)   Wt 130 lb 6.4 oz (59.1 kg)   LMP 12/03/2014   BMI 19.83 kg/m    Objective:   Physical Exam  Constitutional: She is oriented to person, place,  and time. She appears well-nourished.  HENT:  Right Ear: Tympanic membrane and ear canal normal.  Left Ear: Tympanic membrane and ear canal normal.  Nose: Nose normal.  Mouth/Throat: Oropharynx is clear and moist.  Eyes: Pupils are equal, round, and reactive to light. Conjunctivae and EOM are normal.  Neck: Neck supple. No thyromegaly present.  Cardiovascular: Normal rate and regular rhythm.   No murmur heard. Pulmonary/Chest: Effort normal and breath sounds normal. She has no rales.  Abdominal: Soft. Bowel sounds are normal. There is no tenderness.  Musculoskeletal: Normal range of motion.  Lymphadenopathy:    She has no cervical  adenopathy.  Neurological: She is alert and oriented to person, place, and time. She has normal reflexes. No cranial nerve deficit.  Skin: Skin is warm and dry. No rash noted.  Psychiatric: She has a normal mood and affect.          Assessment & Plan:

## 2017-03-04 DIAGNOSIS — Z85828 Personal history of other malignant neoplasm of skin: Secondary | ICD-10-CM | POA: Diagnosis not present

## 2017-03-04 DIAGNOSIS — L578 Other skin changes due to chronic exposure to nonionizing radiation: Secondary | ICD-10-CM | POA: Diagnosis not present

## 2017-03-04 DIAGNOSIS — L718 Other rosacea: Secondary | ICD-10-CM | POA: Diagnosis not present

## 2017-03-16 ENCOUNTER — Encounter: Payer: Self-pay | Admitting: Primary Care

## 2017-03-27 ENCOUNTER — Ambulatory Visit (INDEPENDENT_AMBULATORY_CARE_PROVIDER_SITE_OTHER): Payer: BLUE CROSS/BLUE SHIELD | Admitting: Internal Medicine

## 2017-03-27 VITALS — BP 100/54 | HR 80 | Resp 16 | Ht 68.0 in | Wt 129.0 lb

## 2017-03-27 DIAGNOSIS — R0683 Snoring: Secondary | ICD-10-CM

## 2017-03-27 DIAGNOSIS — G4719 Other hypersomnia: Secondary | ICD-10-CM

## 2017-03-27 NOTE — Patient Instructions (Addendum)
--  Follow up in 3 months if the test is positive and you start CPAP. If your test is negative you may cancel your follow up appt.  --If your test is negative you can use a commercial anti-snoring device.

## 2017-03-27 NOTE — Progress Notes (Signed)
[facility]      Assessment and Plan:  The patient is a 51 year old female with symptoms and signs of obstructive sleep apnea. Loud snoring, witnessed apneas. -We'll send for sleep study. Discussed different options for treatment of sleep apnea if her sleep study is positive, including a dental device versus CPAP. -Discussed that if her sleep study is negative, she can use a commercially available anti-snoring device.   Date: 03/27/2017  MRN# [id number] Tara BirkenheadKathy Parker August 27, 1965    Tara BirkenheadKathy Parker is a 51 y.o. old female seen in consultation for chief complaint of:    Chief Complaint  Patient presents with  . Advice Only    Referred by Vernona RiegerKatherine Clark  . sleep consult    Patient spouse has witnessed apnea during sleep. Patient mentioned during visit with pcp. Pt does snore per spouse at times.    HPI:   Her husband has been waking her up at night because she has not been breathing. She sleeps on her side and uses a pillow to prop herself up with a pillow.  She has never been tested in the past, she is not very sleepy during the day.    PMHX:   Past Medical History:  Diagnosis Date  . Chicken pox   . Postmenopausal vaginal bleeding   . Shingles    Surgical Hx:  Past Surgical History:  Procedure Laterality Date  . CHOLECYSTECTOMY     Family Hx:  Family History  Problem Relation Age of Onset  . Heart disease Mother   . Cancer Maternal Grandmother 27       Uterine  . Heart attack Mother    Social Hx:   Social History  Substance Use Topics  . Smoking status: Never Smoker  . Smokeless tobacco: Never Used  . Alcohol use No   Medication:    Current Outpatient Prescriptions:  .  Ascorbic Acid (VITAMIN C) 1000 MG tablet, Take 1,000 mg by mouth daily., Disp: , Rfl:  .  estradiol (ESTRACE) 1 MG tablet, TAKE 1 T BY MOUTH DAILY AS DIRECTED, Disp: , Rfl: 3 .  metroNIDAZOLE (METROGEL) 0.75 % gel, Apply 1 application topically daily., Disp:  , Rfl: 5 .  Multiple Vitamin (MULTIVITAMIN) capsule, Take 1 capsule by mouth daily., Disp: , Rfl:  .  progesterone (PROMETRIUM) 100 MG capsule, Take one capsule by mouth on days 14 thru 25 every month., Disp: 12 capsule, Rfl: 1   Allergies:  Penicillins  Review of Systems: Gen:  Denies  fever, sweats, chills HEENT: Denies blurred vision, double vision. bleeds, sore throat Cvc:  No dizziness, chest pain. Resp:   Denies cough or sputum production, shortness of breath Gi: Denies swallowing difficulty, stomach pain. Gu:  Denies bladder incontinence, burning urine Ext:   No Joint pain, stiffness. Skin: No skin rash,  hives  Endoc:  No polyuria, polydipsia. Psych: No depression, insomnia. Other:  All other systems were reviewed with the patient and were negative other that what is mentioned in the HPI.   Physical Examination:   VS: BP (!) 100/54 (BP Location: Left Arm, Cuff Size: Normal)   Pulse 80   Resp 16   Ht 5\' 8"  (1.727 m)   Wt 129 lb (58.5 kg)   LMP 12/03/2014   SpO2 100%   BMI 19.61 kg/m   General Appearance: No distress  Neuro:without focal findings,  speech normal,  HEENT: PERRLA, EOM intact.   Pulmonary: normal breath sounds, No wheezing.  CardiovascularNormal S1,S2.  No  m/r/g.   Abdomen: Benign, Soft, non-tender. Renal:  No costovertebral tenderness  GU:  No performed at this time. Endoc: No evident thyromegaly, no signs of acromegaly. Skin:   warm, no rashes, no ecchymosis  Extremities: normal, no cyanosis, clubbing.  Other findings:    LABORATORY PANEL:   CBC No results for input(s): WBC, HGB, HCT, PLT in the last 168 hours. ------------------------------------------------------------------------------------------------------------------  Chemistries  No results for input(s): NA, K, CL, CO2, GLUCOSE, BUN, CREATININE, CALCIUM, MG, AST, ALT, ALKPHOS, BILITOT in the last 168 hours.  Invalid input(s):  GFRCGP ------------------------------------------------------------------------------------------------------------------  Cardiac Enzymes No results for input(s): TROPONINI in the last 168 hours. ------------------------------------------------------------  RADIOLOGY:  No results found.     Thank  you for the consultation and for allowing [facility] to assist in the care of your patient. Our recommendations are noted above.  Please contact us if we can be of further service.   Wells Guiles, MD.  Board Certified in Internal Medicine, Pulmonary Medicine, Critical Care Medicine, and Sleep Medicine.  [facility] Office Number: [phone]  Santiago Glad, M.D.  Billy Fischer, M.D  03/27/2017

## 2017-04-03 ENCOUNTER — Encounter: Payer: Self-pay | Admitting: Family Medicine

## 2017-04-03 ENCOUNTER — Ambulatory Visit (INDEPENDENT_AMBULATORY_CARE_PROVIDER_SITE_OTHER): Payer: BLUE CROSS/BLUE SHIELD | Admitting: Family Medicine

## 2017-04-03 VITALS — BP 88/54 | HR 81 | Wt 129.0 lb

## 2017-04-03 DIAGNOSIS — Z79890 Hormone replacement therapy (postmenopausal): Secondary | ICD-10-CM | POA: Diagnosis not present

## 2017-04-03 DIAGNOSIS — Z23 Encounter for immunization: Secondary | ICD-10-CM | POA: Diagnosis not present

## 2017-04-03 MED ORDER — PROGESTERONE MICRONIZED 100 MG PO CAPS: ORAL_CAPSULE | ORAL | 3 refills | Status: DC

## 2017-04-03 MED ORDER — ESTRADIOL 1 MG PO TABS: ORAL_TABLET | ORAL | 3 refills | Status: DC

## 2017-04-03 NOTE — Progress Notes (Signed)
   Subjective:    Patient ID: Tara Parker is a 51 y.o. female presenting with Menopause  on 04/03/2017  HPI: Has been on HRT for 5 years. Moved here from [place] to be near her only son and grandkids. Works as a Information systems manager. Went through menopause about 5 years ago. Had terrible mood swings. She was not sleeping well. Having night sweats. Occasional hot flash. Has had some PMB in 2016 with negative EMB. None lately.  Review of Systems  Constitutional: Negative for chills and fever.  Respiratory: Negative for shortness of breath.   Cardiovascular: Negative for chest pain.  Gastrointestinal: Negative for abdominal pain, nausea and vomiting.  Genitourinary: Negative for dysuria.  Skin: Negative for rash.      Objective:    BP (!) 88/54   Pulse 81   Wt 129 lb (58.5 kg)   LMP 12/03/2014   BMI 19.61 kg/m  Physical Exam  Constitutional: She is oriented to person, place, and time. She appears well-developed and well-nourished. No distress.  HENT:  Head: Normocephalic and atraumatic.  Eyes: No scleral icterus.  Neck: Neck supple.  Cardiovascular: Normal rate.   Pulmonary/Chest: Effort normal.  Abdominal: Soft.  Neurological: She is alert and oriented to person, place, and time.  Skin: Skin is warm and dry.  Psychiatric: She has a normal mood and affect.        Assessment & Plan:   Problem List Items Addressed This Visit      Unprioritized   Hormone replacement therapy    Advised of HRT risks and benefits.  Discussed bone loss, supplemental Ca and Vitamin D, heart health, breast cancer risks.  Also discussed changes related to menopause, normal progression and resolution of symptoms.  Advised of recommendation to not stay on longer than 5 years and to be on lowest dose possible. She is due for annual exam in May 2019 with pap, she will consider lowering dose at that time. Would consider decreasing to 0.5 mg, then going qod, then 2x/wk, then  stopping.       Relevant Medications   estradiol (ESTRACE) 1 MG tablet   progesterone (PROMETRIUM) 100 MG capsule    Other Visit Diagnoses    Need for immunization against influenza    -  Primary   Relevant Orders   Flu Vaccine QUAD 36+ mos IM      Total face-to-face time with patient: 20 minutes. Over 50% of encounter was spent on counseling and coordination of care. Return in about 3 months (around 07/03/2017).  Reva Bores 04/03/2017 8:29 AM

## 2017-04-03 NOTE — Assessment & Plan Note (Signed)
Advised of HRT risks and benefits.  Discussed bone loss, supplemental Ca and Vitamin D, heart health, breast cancer risks.  Also discussed changes related to menopause, normal progression and resolution of symptoms.  Advised of recommendation to not stay on longer than 5 years and to be on lowest dose possible. She is due for annual exam in May 2019 with pap, she will consider lowering dose at that time. Would consider decreasing to 0.5 mg, then going qod, then 2x/wk, then stopping.

## 2017-04-03 NOTE — Patient Instructions (Signed)

## 2017-04-20 ENCOUNTER — Other Ambulatory Visit: Payer: Self-pay | Admitting: Primary Care

## 2017-04-20 DIAGNOSIS — Z79890 Hormone replacement therapy (postmenopausal): Secondary | ICD-10-CM

## 2017-05-07 ENCOUNTER — Encounter: Payer: Self-pay | Admitting: Internal Medicine

## 2017-05-07 DIAGNOSIS — G4719 Other hypersomnia: Secondary | ICD-10-CM

## 2017-05-11 ENCOUNTER — Telehealth: Payer: Self-pay | Admitting: *Deleted

## 2017-05-11 NOTE — Telephone Encounter (Signed)
Message left for patient to return call.  Result Mild OSA 12 apnea events AHI 5/hr Auto-Cpap with pressure range of 5-15 cm H20

## 2017-05-20 ENCOUNTER — Other Ambulatory Visit: Payer: Self-pay | Admitting: Internal Medicine

## 2017-05-20 DIAGNOSIS — G4733 Obstructive sleep apnea (adult) (pediatric): Secondary | ICD-10-CM

## 2017-05-20 NOTE — Progress Notes (Signed)
Patient aware of sleep study results She requested copy be mailed to her. Orders have been placed Nothing further needed.

## 2017-05-28 ENCOUNTER — Encounter: Payer: Self-pay | Admitting: Radiology

## 2017-06-04 ENCOUNTER — Telehealth: Payer: Self-pay | Admitting: Radiology

## 2017-06-04 NOTE — Telephone Encounter (Signed)
Left message on the cell phone voicemail explaining that Dr Shawnie PonsPratt wanted patient to come back for a 3 month f/u appointment for Hormone Replacement Therapy. Call [email or web address] to schedule appointment.

## 2017-06-24 NOTE — Progress Notes (Signed)
[facility] Consultation      Assessment and Plan:  The patient is a 51 year old female with symptoms and signs of obstructive sleep apnea.  OSA. -Patient has questions about CPAP use and compliance, all of her questions were answered to her satisfaction. - We will start CPAP therapy and see her back in 3 months.   Date: 06/24/2017  MRN# [id number] Fonnie BirkenheadKathy Ebron 28-May-1966    Fonnie BirkenheadKathy Maberry is a 51 y.o. old female seen in consultation for chief complaint of:    Chief Complaint  Patient presents with  . excessive daytime sleepiness    Pt here to go over results of home sleep study. She has not been set up yet.    HPI:  Patient is a 51 year old female she is following up now because of complaints of excessive daytime sleepiness.  She was sent for home sleep study which showed an AHI of 5. she was started on a CPAP auto pressure of 5-15.  Patient did not want to start CPAP yet, she was interested in discussing this with us first.  She has various questions about CPAP use.  We also discussed boil and bite devices, as well as dental devices.   PMHX:   Past Medical History:  Diagnosis Date  . Chicken pox   . Postmenopausal vaginal bleeding   . Shingles    Surgical Hx:  Past Surgical History:  Procedure Laterality Date  . CHOLECYSTECTOMY     Family Hx:  Family History  Problem Relation Age of Onset  . Heart disease Mother   . Heart attack Mother   . Cancer Maternal Grandmother [id number]       Uterine   Social Hx:   Social History   Tobacco Use  . Smoking status: Never Smoker  . Smokeless tobacco: Never Used  Substance Use Topics  . Alcohol use: No    Alcohol/week: 0.0 oz  . Drug use: Not on file   Medication:    Current Outpatient Medications:  .  Ascorbic Acid (VITAMIN C) 1000 MG tablet, Take 1,000 mg by mouth daily., Disp: , Rfl:  .  estradiol (ESTRACE) 1 MG tablet, TAKE 1 T BY MOUTH DAILY AS DIRECTED, Disp: 90 tablet, Rfl: 3 .  metroNIDAZOLE  (METROGEL) 0.75 % gel, Apply 1 application topically daily., Disp: , Rfl: 5 .  Multiple Vitamin (MULTIVITAMIN) capsule, Take 1 capsule by mouth daily., Disp: , Rfl:  .  progesterone (PROMETRIUM) 100 MG capsule, Take one capsule by mouth on days 14 thru 25 every month., Disp: 36 capsule, Rfl: 3   Allergies:  Penicillins  Review of Systems: Gen:  Denies  fever, sweats, chills HEENT: Denies blurred vision, double vision. bleeds, sore throat Cvc:  No dizziness, chest pain. Resp:   Denies cough or sputum production, shortness of breath Gi: Denies swallowing difficulty, stomach pain. Gu:  Denies bladder incontinence, burning urine Ext:   No Joint pain, stiffness. Skin: No skin rash,  hives  Endoc:  No polyuria, polydipsia. Psych: No depression, insomnia. Other:  All other systems were reviewed with the patient and were negative other that what is mentioned in the HPI.   Physical Examination:   VS: BP 118/64 (BP Location: Left Arm, Cuff Size: Normal)   Pulse 73   Resp 16   Ht 5\' 8"  (1.727 m)   Wt 134 lb (60.8 kg)   LMP 12/03/2014   SpO2 99%   BMI 20.37 kg/m   General Appearance: No distress  Neuro:without focal findings,  speech normal,  HEENT: PERRLA, EOM intact.   Pulmonary: normal breath sounds, No wheezing.  CardiovascularNormal S1,S2.  No m/r/g.   Abdomen: Benign, Soft, non-tender. Renal:  No costovertebral tenderness  GU:  No performed at this time. Endoc: No evident thyromegaly, no signs of acromegaly. Skin:   warm, no rashes, no ecchymosis  Extremities: normal, no cyanosis, clubbing.  Other findings:    LABORATORY PANEL:   CBC No results for input(s): WBC, HGB, HCT, PLT in the last 168 hours. ------------------------------------------------------------------------------------------------------------------  Chemistries  No results for input(s): NA, K, CL, CO2, GLUCOSE, BUN, CREATININE, CALCIUM, MG, AST, ALT, ALKPHOS, BILITOT in the last 168 hours.  Invalid  input(s): GFRCGP ------------------------------------------------------------------------------------------------------------------  Cardiac Enzymes No results for input(s): TROPONINI in the last 168 hours. ------------------------------------------------------------  RADIOLOGY:  No results found.     Thank  you for the consultation and for allowing [facility] to assist in the care of your patient. Our recommendations are noted above.  Please contact us if we can be of further service.   Wells Guileseep Eliane Hammersmith, MD.  Board Certified in Internal Medicine, Pulmonary Medicine, Critical Care Medicine, and Sleep Medicine.  [facility] Office Number: [phone]  Santiago Gladavid Kasa, M.D.  Billy Fischeravid Simonds, M.D  06/24/2017

## 2017-06-25 ENCOUNTER — Encounter: Payer: Self-pay | Admitting: Internal Medicine

## 2017-06-25 ENCOUNTER — Ambulatory Visit (INDEPENDENT_AMBULATORY_CARE_PROVIDER_SITE_OTHER): Payer: BLUE CROSS/BLUE SHIELD | Admitting: Internal Medicine

## 2017-06-25 VITALS — BP 118/64 | HR 73 | Resp 16 | Ht 68.0 in | Wt 134.0 lb

## 2017-06-25 DIAGNOSIS — G4719 Other hypersomnia: Secondary | ICD-10-CM

## 2017-06-25 DIAGNOSIS — R0683 Snoring: Secondary | ICD-10-CM | POA: Diagnosis not present

## 2017-06-25 NOTE — Patient Instructions (Signed)
Have a nice holiday We will see you back after you have been on CPAP for 3 months

## 2017-07-17 DIAGNOSIS — G4733 Obstructive sleep apnea (adult) (pediatric): Secondary | ICD-10-CM | POA: Diagnosis not present

## 2017-12-25 DIAGNOSIS — Z1231 Encounter for screening mammogram for malignant neoplasm of breast: Secondary | ICD-10-CM | POA: Diagnosis not present

## 2018-01-13 DIAGNOSIS — D2262 Melanocytic nevi of left upper limb, including shoulder: Secondary | ICD-10-CM | POA: Diagnosis not present

## 2018-01-13 DIAGNOSIS — Z85828 Personal history of other malignant neoplasm of skin: Secondary | ICD-10-CM | POA: Diagnosis not present

## 2018-01-13 DIAGNOSIS — D2272 Melanocytic nevi of left lower limb, including hip: Secondary | ICD-10-CM | POA: Diagnosis not present

## 2018-01-13 DIAGNOSIS — D2271 Melanocytic nevi of right lower limb, including hip: Secondary | ICD-10-CM | POA: Diagnosis not present

## 2018-02-03 DIAGNOSIS — M62830 Muscle spasm of back: Secondary | ICD-10-CM | POA: Diagnosis not present

## 2018-02-03 DIAGNOSIS — M9902 Segmental and somatic dysfunction of thoracic region: Secondary | ICD-10-CM | POA: Diagnosis not present

## 2018-02-03 DIAGNOSIS — R51 Headache: Secondary | ICD-10-CM | POA: Diagnosis not present

## 2018-02-03 DIAGNOSIS — M9901 Segmental and somatic dysfunction of cervical region: Secondary | ICD-10-CM | POA: Diagnosis not present

## 2018-02-05 DIAGNOSIS — H524 Presbyopia: Secondary | ICD-10-CM | POA: Diagnosis not present

## 2018-02-05 DIAGNOSIS — H0234 Blepharochalasis left upper eyelid: Secondary | ICD-10-CM | POA: Diagnosis not present

## 2018-02-05 DIAGNOSIS — H52203 Unspecified astigmatism, bilateral: Secondary | ICD-10-CM | POA: Diagnosis not present

## 2018-02-05 DIAGNOSIS — H5203 Hypermetropia, bilateral: Secondary | ICD-10-CM | POA: Diagnosis not present

## 2018-03-05 DIAGNOSIS — M62830 Muscle spasm of back: Secondary | ICD-10-CM | POA: Diagnosis not present

## 2018-03-05 DIAGNOSIS — R51 Headache: Secondary | ICD-10-CM | POA: Diagnosis not present

## 2018-03-05 DIAGNOSIS — M9902 Segmental and somatic dysfunction of thoracic region: Secondary | ICD-10-CM | POA: Diagnosis not present

## 2018-03-05 DIAGNOSIS — M9901 Segmental and somatic dysfunction of cervical region: Secondary | ICD-10-CM | POA: Diagnosis not present

## 2018-03-11 DIAGNOSIS — M9901 Segmental and somatic dysfunction of cervical region: Secondary | ICD-10-CM | POA: Diagnosis not present

## 2018-03-11 DIAGNOSIS — M9902 Segmental and somatic dysfunction of thoracic region: Secondary | ICD-10-CM | POA: Diagnosis not present

## 2018-03-11 DIAGNOSIS — R51 Headache: Secondary | ICD-10-CM | POA: Diagnosis not present

## 2018-03-11 DIAGNOSIS — M62830 Muscle spasm of back: Secondary | ICD-10-CM | POA: Diagnosis not present

## 2018-03-31 ENCOUNTER — Ambulatory Visit: Payer: BLUE CROSS/BLUE SHIELD | Admitting: Family Medicine

## 2018-03-31 ENCOUNTER — Encounter: Payer: Self-pay | Admitting: Family Medicine

## 2018-03-31 VITALS — BP 90/70 | HR 71 | Temp 98.5°F | Ht 68.0 in | Wt 135.0 lb

## 2018-03-31 DIAGNOSIS — M7631 Iliotibial band syndrome, right leg: Secondary | ICD-10-CM | POA: Diagnosis not present

## 2018-03-31 DIAGNOSIS — Z23 Encounter for immunization: Secondary | ICD-10-CM

## 2018-03-31 NOTE — Progress Notes (Signed)
Dr. Karleen Hampshire T. Lela Murfin, MD, CAQ Sports Medicine Primary Care and Sports Medicine [address] [place], [address] Phone: [phone] Fax: [phone]  03/31/2018  Patient: Tara Parker, MRN: [id number], DOB: 02-22-1966, 52 y.o.  Primary Physician:  Doreene Nest, NP   Chief Complaint  Patient presents with  . Leg Pain    Right x 2 months   Subjective:   Pinky Gullage is a 52 y.o. very pleasant female patient who presents with the following:  R side leg after - R lateral leg and distal.  No lower body.  Ibuprofen.  Stayed off of it -   She is a very pleasant lady, and I remember her well.  She was lifting legs, and she felt some pain on the lateral aspect of her thigh approximately 2 months ago.  Since that time, she is not done any leg work, and she is taking a lot of ibuprofen and iced her thigh.  She continues to have pain on the lateral aspect distally.  She initially was having some pain more proximally as well.  She is not having any pain on the anterior posterior thigh.  She is not having any pain in the knee and no pain in the groin.  Past Medical History, Surgical History, Social History, Family History, Problem List, Medications, and Allergies have been reviewed and updated if relevant.  Patient Active Problem List   Diagnosis Date Noted  . Hormone replacement therapy 04/03/2017  . Preventative health care 05/04/2015  . Knee pain, chronic 12/28/2014    Past Medical History:  Diagnosis Date  . Chicken pox   . Postmenopausal vaginal bleeding   . Shingles     Past Surgical History:  Procedure Laterality Date  . CHOLECYSTECTOMY      Social History   Socioeconomic History  . Marital status: Married    Spouse name: Not on file  . Number of children: Not on file  . Years of education: Not on file  . Highest education level: Not on file  Occupational History  . Not on file  Social Needs  . Financial resource strain: Not on file  . Food  insecurity:    Worry: Not on file    Inability: Not on file  . Transportation needs:    Medical: Not on file    Non-medical: Not on file  Tobacco Use  . Smoking status: Never Smoker  . Smokeless tobacco: Never Used  Substance and Sexual Activity  . Alcohol use: No    Alcohol/week: 0.0 standard drinks  . Drug use: No  . Sexual activity: Not on file  Lifestyle  . Physical activity:    Days per week: Not on file    Minutes per session: Not on file  . Stress: Not on file  Relationships  . Social connections:    Talks on phone: Not on file    Gets together: Not on file    Attends religious service: Not on file    Active member of club or organization: Not on file    Attends meetings of clubs or organizations: Not on file    Relationship status: Not on file  . Intimate partner violence:    Fear of current or ex partner: Not on file    Emotionally abused: Not on file    Physically abused: Not on file    Forced sexual activity: Not on file  Other Topics Concern  . Not on file  Social History Narrative  Married.   1 child, age 67.   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   Enjoys reading, exercise, being outdoors.    Family History  Problem Relation Age of Onset  . Heart disease Mother   . Heart attack Mother   . Cancer Maternal Grandmother 27       Uterine    Allergies  Allergen Reactions  . Penicillins     Medication list reviewed and updated in full in [facility].  GEN: No fevers, chills. Nontoxic. Primarily MSK c/o today. MSK: Detailed in the HPI GI: tolerating PO intake without difficulty Neuro: No numbness, parasthesias, or tingling associated. Otherwise the pertinent positives of the ROS are noted above.   Objective:   BP 90/70   Pulse 71   Temp 98.5 F (36.9 C) (Oral)   Ht 5\' 8"  (1.727 m)   Wt 135 lb (61.2 kg)   LMP 12/03/2014   BMI 20.53 kg/m    GEN: WDWN, NAD, Non-toxic, Alert & Oriented  x 3 HEENT: Atraumatic, Normocephalic.  Ears and Nose: No external deformity. EXTR: No clubbing/cyanosis/edema NEURO: Normal gait.  PSYCH: Normally interactive. Conversant. Not depressed or anxious appearing.  Calm demeanor.   HIP EXAM: SIDE: R ROM: Abduction, Flexion, Internal and External range of motion: full Pain with terminal IROM and EROM: none GTB: NT SLR: NEG Knees: No effusion FABER: NT REVERSE FABER: NT, neg Piriformis: NT at direct palpation Str: flexion: 4/5 abduction: 3++/5 adduction: 4/5 Strength testing non-tender L is a strong 5/5  Knee ROM 0-135 B There is pain along the distal ITB, but a nonprovocable obers  Radiology: No results found.  Assessment and Plan:   Iliotibial band syndrome of right side  Need for prophylactic vaccination and inoculation against influenza - Plan: Flu Vaccine QUAD 6+ mos PF IM (Fluarix Quad PF)  Appears to ITBS. Secondary weak hip stabilizers. Return to light leg work, cardio.   Reviewed classic ITB stretching and hip strengthening. Very knowledgable athlete, and her husband who is a trainer can assist with pelvic stability training.   Follow-up: 6 weeks if not better  Orders Placed This Encounter  Procedures  . Flu Vaccine QUAD 6+ mos PF IM (Fluarix Quad PF)    Signed,  Markeisha Mancias T. Ellin Fitzgibbons, MD   Allergies as of 03/31/2018      Reactions   Penicillins       Medication List        Accurate as of 03/31/18 11:59 PM. Always use your most recent med list.          estradiol 1 MG tablet Commonly known as:  ESTRACE TAKE 1 T BY MOUTH DAILY AS DIRECTED   multivitamin capsule Take 1 capsule by mouth daily.   progesterone 100 MG capsule Commonly known as:  PROMETRIUM Take one capsule by mouth on days 14 thru 25 every month.   vitamin C 1000 MG tablet Take 1,000 mg by mouth daily.

## 2018-04-12 ENCOUNTER — Other Ambulatory Visit: Payer: Self-pay

## 2018-04-12 DIAGNOSIS — Z79890 Hormone replacement therapy (postmenopausal): Secondary | ICD-10-CM

## 2018-04-12 MED ORDER — ESTRADIOL 1 MG PO TABS: ORAL_TABLET | ORAL | 3 refills | Status: DC

## 2018-04-12 NOTE — Telephone Encounter (Signed)
Refill on estrace cream

## 2018-08-03 ENCOUNTER — Other Ambulatory Visit: Payer: Self-pay | Admitting: Family Medicine

## 2018-08-03 DIAGNOSIS — Z79890 Hormone replacement therapy (postmenopausal): Secondary | ICD-10-CM

## 2018-08-19 NOTE — Progress Notes (Signed)
Last pap 11/2015- negative  Mammogram 12/25/2017- normal

## 2018-08-23 ENCOUNTER — Encounter: Payer: Self-pay | Admitting: Family Medicine

## 2018-08-23 ENCOUNTER — Ambulatory Visit (INDEPENDENT_AMBULATORY_CARE_PROVIDER_SITE_OTHER): Payer: BLUE CROSS/BLUE SHIELD | Admitting: Family Medicine

## 2018-08-23 VITALS — BP 104/67 | HR 69 | Ht 65.5 in | Wt 132.0 lb

## 2018-08-23 DIAGNOSIS — N898 Other specified noninflammatory disorders of vagina: Secondary | ICD-10-CM

## 2018-08-23 DIAGNOSIS — Z1151 Encounter for screening for human papillomavirus (HPV): Secondary | ICD-10-CM

## 2018-08-23 DIAGNOSIS — Z01419 Encounter for gynecological examination (general) (routine) without abnormal findings: Secondary | ICD-10-CM | POA: Diagnosis not present

## 2018-08-23 DIAGNOSIS — Z124 Encounter for screening for malignant neoplasm of cervix: Secondary | ICD-10-CM

## 2018-08-23 DIAGNOSIS — Z79890 Hormone replacement therapy (postmenopausal): Secondary | ICD-10-CM

## 2018-08-23 MED ORDER — ESTRADIOL 0.5 MG PO TABS: ORAL_TABLET | ORAL | 0 refills | Status: DC

## 2018-08-23 MED ORDER — PRASTERONE 6.5 MG VA INST: 1.0000 | VAGINAL_INSERT | Freq: Every day | VAGINAL | 2 refills | Status: DC

## 2018-08-23 MED ORDER — PROGESTERONE MICRONIZED 100 MG PO CAPS: ORAL_CAPSULE | ORAL | 0 refills | Status: DC

## 2018-08-23 NOTE — Assessment & Plan Note (Addendum)
Trial of adding intrarosa. Safety profile reviewed.

## 2018-08-23 NOTE — Progress Notes (Addendum)
  Subjective:     Tara Parker is a 53 y.o. female and is here for a comprehensive physical exam. The patient reports no problems. 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 She is on HRT, now x 6 yrs, using estrogen and progesterone. Still with vaginal dryness. Willing to try to step down her dosing.  The following portions of the patient's history were reviewed and updated as appropriate: allergies, current medications, past family history, past medical history, past social history, past surgical history and problem list.  Review of Systems Pertinent items noted in HPI and remainder of comprehensive ROS otherwise negative.   Objective:    BP 104/67   Pulse 69   Ht 5' 5.5" (1.664 m)   Wt 132 lb (59.9 kg)   LMP 12/03/2014   BMI 21.63 kg/m  General appearance: alert, cooperative and appears stated age Neck: no adenopathy, supple, symmetrical, trachea midline and thyroid not enlarged, symmetric, no tenderness/mass/nodules Lungs: clear to auscultation bilaterally Breasts: bilateral implants noted, no palpable masses Heart: regular rate and rhythm, S1, S2 normal, no murmur, click, rub or gallop Abdomen: soft, non-tender; bowel sounds normal; no masses,  no organomegaly Pelvic: cervix normal in appearance, external genitalia normal, no adnexal masses or tenderness, no cervical motion tenderness, uterus normal size, shape, and consistency and vagina normal without discharge Extremities: extremities normal, atraumatic, no cyanosis or edema Pulses: 2+ and symmetric Skin: Skin color, texture, turgor normal. No rashes or lesions Lymph nodes: Cervical, supraclavicular, and axillary nodes normal. Neurologic: Grossly normal    Assessment:    Healthy female exam.      Plan:      Problem List Items Addressed This Visit      Unprioritized   Hormone replacement therapy - Primary    Begin stepping down dose to 0.5 mg daily of estrogen. If not ok, then can  continue on. Previously reviewed risks with pt. If ok, consider 0.5 qod.      Relevant Medications   estradiol (ESTRACE) 0.5 MG tablet   progesterone (PROMETRIUM) 100 MG capsule   Vaginal dryness    Trial of adding intrarosa. Safety profile reviewed.      Relevant Medications   Prasterone (INTRAROSA) 6.5 MG INST    Other Visit Diagnoses    Screening for malignant neoplasm of cervix       Relevant Orders   Cytology - PAP( [facility])   Encounter for gynecological examination without abnormal finding         Return in 1 year (on 08/24/2019).  See After Visit Summary for Counseling Recommendations

## 2018-08-23 NOTE — Patient Instructions (Signed)
Preventive Care 40-64 Years, Female Preventive care refers to lifestyle choices and visits with your health care provider that can promote health and wellness. What does preventive care include?   A yearly physical exam. This is also called an annual well check.  Dental exams once or twice a year.  Routine eye exams. Ask your health care provider how often you should have your eyes checked.  Personal lifestyle choices, including: ? Daily care of your teeth and gums. ? Regular physical activity. ? Eating a healthy diet. ? Avoiding tobacco and drug use. ? Limiting alcohol use. ? Practicing safe sex. ? Taking low-dose aspirin daily starting at age 50. ? Taking vitamin and mineral supplements as recommended by your health care provider. What happens during an annual well check? The services and screenings done by your health care provider during your annual well check will depend on your age, overall health, lifestyle risk factors, and family history of disease. Counseling Your health care provider may ask you questions about your:  Alcohol use.  Tobacco use.  Drug use.  Emotional well-being.  Home and relationship well-being.  Sexual activity.  Eating habits.  Work and work environment.  Method of birth control.  Menstrual cycle.  Pregnancy history. Screening You may have the following tests or measurements:  Height, weight, and BMI.  Blood pressure.  Lipid and cholesterol levels. These may be checked every 5 years, or more frequently if you are over 50 years old.  Skin check.  Lung cancer screening. You may have this screening every year starting at age 55 if you have a 30-pack-year history of smoking and currently smoke or have quit within the past 15 years.  Colorectal cancer screening. All adults should have this screening starting at age 50 and continuing until age 75. Your health care provider may recommend screening at age 45. You will have tests every  1-10 years, depending on your results and the type of screening test. People at increased risk should start screening at an earlier age. Screening tests may include: ? Guaiac-based fecal occult blood testing. ? Fecal immunochemical test (FIT). ? Stool DNA test. ? Virtual colonoscopy. ? Sigmoidoscopy. During this test, a flexible tube with a tiny camera (sigmoidoscope) is used to examine your rectum and lower colon. The sigmoidoscope is inserted through your anus into your rectum and lower colon. ? Colonoscopy. During this test, a long, thin, flexible tube with a tiny camera (colonoscope) is used to examine your entire colon and rectum.  Hepatitis C blood test.  Hepatitis B blood test.  Sexually transmitted disease (STD) testing.  Diabetes screening. This is done by checking your blood sugar (glucose) after you have not eaten for a while (fasting). You may have this done every 1-3 years.  Mammogram. This may be done every 1-2 years. Talk to your health care provider about when you should start having regular mammograms. This may depend on whether you have a family history of breast cancer.  BRCA-related cancer screening. This may be done if you have a family history of breast, ovarian, tubal, or peritoneal cancers.  Pelvic exam and Pap test. This may be done every 3 years starting at age 21. Starting at age 30, this may be done every 5 years if you have a Pap test in combination with an HPV test.  Bone density scan. This is done to screen for osteoporosis. You may have this scan if you are at high risk for osteoporosis. Discuss your test results, treatment options,   and if necessary, the need for more tests with your health care provider. Vaccines Your health care provider may recommend certain vaccines, such as:  Influenza vaccine. This is recommended every year.  Tetanus, diphtheria, and acellular pertussis (Tdap, Td) vaccine. You may need a Td booster every 10 years.  Varicella  vaccine. You may need this if you have not been vaccinated.  Zoster vaccine. You may need this after age 38.  Measles, mumps, and rubella (MMR) vaccine. You may need at least one dose of MMR if you were born in 1957 or later. You may also need a second dose.  Pneumococcal 13-valent conjugate (PCV13) vaccine. You may need this if you have certain conditions and were not previously vaccinated.  Pneumococcal polysaccharide (PPSV23) vaccine. You may need one or two doses if you smoke cigarettes or if you have certain conditions.  Meningococcal vaccine. You may need this if you have certain conditions.  Hepatitis A vaccine. You may need this if you have certain conditions or if you travel or work in places where you may be exposed to hepatitis A.  Hepatitis B vaccine. You may need this if you have certain conditions or if you travel or work in places where you may be exposed to hepatitis B.  Haemophilus influenzae type b (Hib) vaccine. You may need this if you have certain conditions. Talk to your health care provider about which screenings and vaccines you need and how often you need them. This information is not intended to replace advice given to you by your health care provider. Make sure you discuss any questions you have with your health care provider. Document Released: 08/03/2015 Document Revised: 08/27/2017 Document Reviewed: 05/08/2015 Elsevier Interactive Patient Education  2019 [employer].

## 2018-08-23 NOTE — Assessment & Plan Note (Signed)
Begin stepping down dose to 0.5 mg daily of estrogen. If not ok, then can continue on. Previously reviewed risks with pt. If ok, consider 0.5 qod.

## 2018-08-24 LAB — CYTOLOGY - PAP
DIAGNOSIS: NEGATIVE
HPV (WINDOPATH): NOT DETECTED

## 2018-08-31 DIAGNOSIS — H02831 Dermatochalasis of right upper eyelid: Secondary | ICD-10-CM | POA: Diagnosis not present

## 2018-08-31 DIAGNOSIS — H02834 Dermatochalasis of left upper eyelid: Secondary | ICD-10-CM | POA: Diagnosis not present

## 2018-09-26 ENCOUNTER — Other Ambulatory Visit: Payer: Self-pay | Admitting: Family Medicine

## 2018-09-26 DIAGNOSIS — Z79890 Hormone replacement therapy (postmenopausal): Secondary | ICD-10-CM

## 2018-10-09 ENCOUNTER — Other Ambulatory Visit: Payer: Self-pay | Admitting: Family Medicine

## 2018-10-09 DIAGNOSIS — Z79890 Hormone replacement therapy (postmenopausal): Secondary | ICD-10-CM

## 2018-10-10 MED ORDER — ESTRADIOL 0.5 MG PO TABS: 0.5000 mg | ORAL_TABLET | Freq: Every day | ORAL | 1 refills | Status: DC

## 2018-10-10 NOTE — Addendum Note (Signed)
Addended by: Reva Bores on: 10/10/2018 11:21 AM   Modules accepted: Orders

## 2018-10-15 MED ORDER — ESTRADIOL 0.5 MG PO TABS: 0.5000 mg | ORAL_TABLET | Freq: Every day | ORAL | 1 refills | Status: DC

## 2018-10-15 NOTE — Addendum Note (Signed)
Addended by: Reva Bores on: 10/15/2018 07:58 AM   Modules accepted: Orders

## 2018-10-25 ENCOUNTER — Other Ambulatory Visit: Payer: Self-pay | Admitting: *Deleted

## 2018-10-25 DIAGNOSIS — Z79890 Hormone replacement therapy (postmenopausal): Secondary | ICD-10-CM

## 2018-10-25 MED ORDER — PROGESTERONE MICRONIZED 100 MG PO CAPS: ORAL_CAPSULE | ORAL | 0 refills | Status: DC

## 2019-01-16 ENCOUNTER — Other Ambulatory Visit: Payer: Self-pay | Admitting: Family Medicine

## 2019-01-16 DIAGNOSIS — Z79890 Hormone replacement therapy (postmenopausal): Secondary | ICD-10-CM

## 2019-03-23 DIAGNOSIS — Z1231 Encounter for screening mammogram for malignant neoplasm of breast: Secondary | ICD-10-CM | POA: Diagnosis not present

## 2019-03-23 LAB — HM MAMMOGRAPHY

## 2019-04-02 ENCOUNTER — Encounter: Payer: Self-pay | Admitting: Primary Care

## 2019-04-26 ENCOUNTER — Other Ambulatory Visit: Payer: Self-pay | Admitting: *Deleted

## 2019-04-26 DIAGNOSIS — Z79890 Hormone replacement therapy (postmenopausal): Secondary | ICD-10-CM

## 2019-04-26 MED ORDER — ESTRADIOL 0.5 MG PO TABS: 0.5000 mg | ORAL_TABLET | Freq: Every day | ORAL | 1 refills | Status: DC

## 2019-04-26 MED ORDER — PROGESTERONE MICRONIZED 100 MG PO CAPS: ORAL_CAPSULE | ORAL | 0 refills | Status: DC

## 2019-05-16 ENCOUNTER — Encounter: Payer: Self-pay | Admitting: Emergency Medicine

## 2019-05-16 ENCOUNTER — Telehealth: Payer: Self-pay | Admitting: Primary Care

## 2019-05-16 ENCOUNTER — Other Ambulatory Visit: Payer: Self-pay

## 2019-05-16 ENCOUNTER — Emergency Department
Admission: EM | Admit: 2019-05-16 | Discharge: 2019-05-16 | Disposition: A | Discharge status: Home / Self Care | Payer: BC Managed Care – PPO | Attending: Emergency Medicine | Admitting: Emergency Medicine

## 2019-05-16 DIAGNOSIS — H109 Unspecified conjunctivitis: Secondary | ICD-10-CM | POA: Diagnosis not present

## 2019-05-16 DIAGNOSIS — Y92009 Unspecified place in unspecified non-institutional (private) residence as the place of occurrence of the external cause: Secondary | ICD-10-CM | POA: Diagnosis not present

## 2019-05-16 DIAGNOSIS — X58XXXA Exposure to other specified factors, initial encounter: Secondary | ICD-10-CM | POA: Diagnosis not present

## 2019-05-16 DIAGNOSIS — H10211 Acute toxic conjunctivitis, right eye: Secondary | ICD-10-CM | POA: Insufficient documentation

## 2019-05-16 DIAGNOSIS — S0591XA Unspecified injury of right eye and orbit, initial encounter: Secondary | ICD-10-CM | POA: Diagnosis not present

## 2019-05-16 DIAGNOSIS — Z79899 Other long term (current) drug therapy: Secondary | ICD-10-CM | POA: Diagnosis not present

## 2019-05-16 DIAGNOSIS — Y999 Unspecified external cause status: Secondary | ICD-10-CM | POA: Diagnosis not present

## 2019-05-16 DIAGNOSIS — T65891A Toxic effect of other specified substances, accidental (unintentional), initial encounter: Secondary | ICD-10-CM | POA: Insufficient documentation

## 2019-05-16 DIAGNOSIS — Y9389 Activity, other specified: Secondary | ICD-10-CM | POA: Insufficient documentation

## 2019-05-16 MED ORDER — FLUORESCEIN SODIUM 1 MG OP STRP
1.0000 | ORAL_STRIP | Freq: Once | OPHTHALMIC | Status: AC
  Administered 2019-05-16: 1 via OPHTHALMIC
  Filled 2019-05-16: qty 1

## 2019-05-16 MED ORDER — OLOPATADINE HCL 0.1 % OP SOLN: 1.0000 [drp] | Freq: Two times a day (BID) | OPHTHALMIC | 0 refills | Status: DC

## 2019-05-16 MED ORDER — SULFACETAMIDE SODIUM 10 % OP SOLN: 2.0000 [drp] | Freq: Four times a day (QID) | OPHTHALMIC | 0 refills | Status: AC

## 2019-05-16 MED ORDER — EYE WASH OPHTH SOLN
10.0000 [drp] | OPHTHALMIC | Status: DC | PRN
  Filled 2019-05-16: qty 118

## 2019-05-16 MED ORDER — TETRACAINE HCL 0.5 % OP SOLN
2.0000 [drp] | Freq: Once | OPHTHALMIC | Status: AC
  Administered 2019-05-16: 2 [drp] via OPHTHALMIC
  Filled 2019-05-16: qty 4

## 2019-05-16 NOTE — ED Provider Notes (Signed)
[facility] Emergency Department Provider Note   ____________________________________________   First MD Initiated Contact with Patient 05/16/19 [phone]     (approximate)  I have reviewed the triage vital signs and the nursing notes.   HISTORY  Chief Complaint Eye Problem    HPI Tara Parker is a 53 y.o. female patient complain of right arm pain secondary to exposed peroxide.  Patient states she mistakenly put her contact lens in a peroxide before inserted into her eye.  Patient that she rinsed the eye copiously last night.  Patient states she continued to have pain.  Patient rates her pain as a 6/10.  Patient described pain as "achy".  Patient is light sensitive.  No other palliative measures for complaint.         Past Medical History:  Diagnosis Date  . Chicken pox   . Postmenopausal vaginal bleeding   . Shingles     Patient Active Problem List   Diagnosis Date Noted  . Vaginal dryness 08/23/2018  . Hormone replacement therapy 04/03/2017  . Preventative health care 05/04/2015  . Knee pain, chronic 12/28/2014    Past Surgical History:  Procedure Laterality Date  . BREAST ENHANCEMENT SURGERY    . CHOLECYSTECTOMY      Prior to Admission medications   Medication Sig Start Date End Date Taking? Authorizing Provider  Ascorbic Acid (VITAMIN C) 1000 MG tablet Take 1,000 mg by mouth daily.    [provider]  cholecalciferol (VITAMIN D3) 25 MCG (1000 UT) tablet Take 1,000 Units by mouth daily.    [provider]  estradiol (ESTRACE) 0.5 MG tablet Take 1 tablet (0.5 mg total) by mouth daily. 04/26/19   Reva Bores, MD  Multiple Vitamin (MULTIVITAMIN) capsule Take 1 capsule by mouth daily.    [provider]  olopatadine (PATANOL) 0.1 % ophthalmic solution Place 1 drop into the right eye 2 (two) times daily. 05/16/19 05/15/20  Joni Reining, PA-C  Prasterone (INTRAROSA) 6.5 MG INST Place 1 suppository vaginally  daily. 08/23/18   Reva Bores, MD  progesterone (PROMETRIUM) 100 MG capsule TAKE 1 CAPSULE ON DAY 14-25 MONTHLY 04/26/19   Reva Bores, MD  sulfacetamide (BLEPH-10) 10 % ophthalmic solution Place 2 drops into the right eye 4 (four) times daily for 10 days. 05/16/19 05/26/19  Joni Reining, PA-C    Allergies Penicillins  Family History  Problem Relation Age of Onset  . Heart disease Mother   . Heart attack Mother   . Cancer Maternal Grandmother 73       Uterine    Social History Social History   Tobacco Use  . Smoking status: Never Smoker  . Smokeless tobacco: Never Used  Substance Use Topics  . Alcohol use: No    Alcohol/week: 0.0 standard drinks  . Drug use: No    Review of Systems  Constitutional: No fever/chills Eyes:   Right eye pain ENT: No sore throat. Cardiovascular: Denies chest pain. Respiratory: Denies shortness of breath. Gastrointestinal: No abdominal pain.  No nausea, no vomiting.  No diarrhea.  No constipation. Genitourinary: Negative for dysuria. Musculoskeletal: Negative for back pain. Skin: Negative for rash. Neurological: Negative for headaches, focal weakness or numbness.  Allergic/Immunilogical: Penicillin ____________________________________________   PHYSICAL EXAM:  VITAL SIGNS: ED Triage Vitals  Enc Vitals Group     BP 05/16/19 0922 110/60     Pulse Rate 05/16/19 0922 69     Resp 05/16/19 0922 16  Temp 05/16/19 0922 99 F (37.2 C)     Temp Source 05/16/19 0922 Oral     SpO2 05/16/19 0922 97 %     Weight 05/16/19 0915 125 lb (56.7 kg)     Height 05/16/19 0915 5' 5.5" (1.664 m)     Head Circumference --      Peak Flow --      Pain Score 05/16/19 0915 6     Pain Loc --      Pain Edu? --      Excl. in [place]? --     Constitutional: Alert and oriented. Well appearing and in no acute distress. Eyes: Visual acuity not measured secondary to patient discomfort.  Right conjunctiva is erythematous.  PERRL. EOMI. fluorescein stain  reveals no ulcers or lesions. Cardiovascular: Normal rate, regular rhythm. Grossly normal heart sounds.  Good peripheral circulation. Respiratory: Normal respiratory effort.  No retractions. Lungs CTAB. Neurologic:  Normal speech and language. No gross focal neurologic deficits are appreciated. No gait instability. Skin:  Skin is warm, dry and intact. No rash noted. Psychiatric: Mood and affect are normal. Speech and behavior are normal.  ____________________________________________   LABS (all labs ordered are listed, but only abnormal results are displayed)  Labs Reviewed - No data to display ____________________________________________  EKG   ____________________________________________  RADIOLOGY  ED MD interpretation:    Official radiology report(s): No results found.  ____________________________________________   PROCEDURES  Procedure(s) performed (including Critical Care):  Procedures   ____________________________________________   INITIAL IMPRESSION / ASSESSMENT AND PLAN / ED COURSE  As part of my medical decision making, I reviewed the following data within the electronic MEDICAL RECORD NUMBER         Right eye pain secondary to chemical conjunctivitis.  Patient given discharge care instruction.  Patient vies use eyedrops as directed.  Patient advised to follow-up with the ophthalmology if no improvement or worsening complaint in the next 72 hours.      ____________________________________________   FINAL CLINICAL IMPRESSION(S) / ED DIAGNOSES  Final diagnoses:  Chemical conjunctivitis of right eye     ED Discharge Orders         Ordered    olopatadine (PATANOL) 0.1 % ophthalmic solution  2 times daily     05/16/19 0944    sulfacetamide (BLEPH-10) 10 % ophthalmic solution  4 times daily     05/16/19 0944           Note:  This document was prepared using Dragon voice recognition software and may include unintentional dictation errors.     Sable Feil, PA-C 05/16/19 9417    Duffy Bruce, MD 05/16/19 [phone]

## 2019-05-16 NOTE — Telephone Encounter (Signed)
Noted and agree. 

## 2019-05-16 NOTE — ED Notes (Signed)
See triage note  Presents with right eye irritation   States she put her contacts in H2O2 by mistake  States she irrigated last pm  States her eye is still painful

## 2019-05-16 NOTE — Telephone Encounter (Signed)
Patient's husband called today stating that the patient had hydroperoxide in her eye and was unable to see. Spoke with Mearl Latin and she advised that the patient needed to go straight to the ED . Husband stated they will be going to [facility] for patient to be evaluated

## 2019-05-16 NOTE — ED Triage Notes (Signed)
Got peroxide in right eye--she mistakenly put peroxide in contact case and then put the contact in eye.  She rinsed well last night

## 2019-06-03 DIAGNOSIS — Z20828 Contact with and (suspected) exposure to other viral communicable diseases: Secondary | ICD-10-CM | POA: Diagnosis not present

## 2019-06-29 ENCOUNTER — Encounter: Payer: Self-pay | Admitting: Radiology

## 2019-07-04 ENCOUNTER — Other Ambulatory Visit: Payer: Self-pay | Admitting: Family Medicine

## 2019-07-04 DIAGNOSIS — Z79890 Hormone replacement therapy (postmenopausal): Secondary | ICD-10-CM

## 2019-09-18 ENCOUNTER — Other Ambulatory Visit: Payer: Self-pay | Admitting: Family Medicine

## 2019-09-18 DIAGNOSIS — Z79890 Hormone replacement therapy (postmenopausal): Secondary | ICD-10-CM

## 2019-09-25 ENCOUNTER — Other Ambulatory Visit: Payer: Self-pay | Admitting: Family Medicine

## 2019-09-25 DIAGNOSIS — Z79890 Hormone replacement therapy (postmenopausal): Secondary | ICD-10-CM

## 2019-09-29 DIAGNOSIS — Z1159 Encounter for screening for other viral diseases: Secondary | ICD-10-CM | POA: Diagnosis not present

## 2019-10-11 DIAGNOSIS — H524 Presbyopia: Secondary | ICD-10-CM | POA: Diagnosis not present

## 2019-10-11 DIAGNOSIS — H5203 Hypermetropia, bilateral: Secondary | ICD-10-CM | POA: Diagnosis not present

## 2019-10-11 DIAGNOSIS — H52203 Unspecified astigmatism, bilateral: Secondary | ICD-10-CM | POA: Diagnosis not present

## 2019-11-09 ENCOUNTER — Other Ambulatory Visit: Payer: Self-pay | Admitting: Physician Assistant

## 2019-11-09 DIAGNOSIS — X32XXXA Exposure to sunlight, initial encounter: Secondary | ICD-10-CM | POA: Diagnosis not present

## 2019-11-09 DIAGNOSIS — D2261 Melanocytic nevi of right upper limb, including shoulder: Secondary | ICD-10-CM | POA: Diagnosis not present

## 2019-11-09 DIAGNOSIS — Z85828 Personal history of other malignant neoplasm of skin: Secondary | ICD-10-CM | POA: Diagnosis not present

## 2019-11-09 DIAGNOSIS — L570 Actinic keratosis: Secondary | ICD-10-CM | POA: Diagnosis not present

## 2019-11-09 DIAGNOSIS — D2262 Melanocytic nevi of left upper limb, including shoulder: Secondary | ICD-10-CM | POA: Diagnosis not present

## 2019-11-09 DIAGNOSIS — D225 Melanocytic nevi of trunk: Secondary | ICD-10-CM | POA: Diagnosis not present

## 2019-12-25 NOTE — Progress Notes (Deleted)
    Sabrine Patchen T. Armentha Branagan, MD, CAQ Sports Medicine  Primary Care and Sports Medicine [facility] [address] [place], [address]  Phone: [phone]  FAX: [phone]  Tara Parker - 54 y.o. female  MRN [id number]  Date of Birth: Dec 21, 1965  Date: 12/26/2019  PCP: Doreene Nest, NP  Referral: Doreene Nest, NP  No chief complaint on file.   This visit occurred during the SARS-CoV-2 public health emergency.  Safety protocols were in place, including screening questions prior to the visit, additional usage of staff PPE, and extensive cleaning of exam room while observing appropriate contact time as indicated for disinfecting solutions.   Subjective:   Tara Parker is a 54 y.o. very pleasant female patient with There is no height or weight on file to calculate BMI. who presents with the following:  She is a very nice lady and she presents with some ongoing pain at the left knee. Previously I have seen her for some right-sided knee pain in 2019.  She is a healthy young lady and she has effectively no significant medical history.  Review of Systems is noted in the HPI, as appropriate   Objective:   LMP 12/03/2014   ***  Radiology: No results found.  Assessment and Plan:   ***

## 2019-12-26 ENCOUNTER — Ambulatory Visit: Payer: BC Managed Care – PPO | Admitting: Family Medicine

## 2020-01-26 ENCOUNTER — Other Ambulatory Visit: Payer: Self-pay | Admitting: Family Medicine

## 2020-01-26 DIAGNOSIS — Z79890 Hormone replacement therapy (postmenopausal): Secondary | ICD-10-CM

## 2020-03-29 ENCOUNTER — Other Ambulatory Visit: Payer: Self-pay | Admitting: Family Medicine

## 2020-03-29 DIAGNOSIS — Z79890 Hormone replacement therapy (postmenopausal): Secondary | ICD-10-CM

## 2020-03-30 DIAGNOSIS — B029 Zoster without complications: Secondary | ICD-10-CM | POA: Diagnosis not present

## 2020-04-10 ENCOUNTER — Ambulatory Visit (INDEPENDENT_AMBULATORY_CARE_PROVIDER_SITE_OTHER): Payer: BC Managed Care – PPO | Admitting: Family Medicine

## 2020-04-10 ENCOUNTER — Other Ambulatory Visit: Payer: Self-pay

## 2020-04-10 ENCOUNTER — Encounter: Payer: Self-pay | Admitting: Family Medicine

## 2020-04-10 DIAGNOSIS — R21 Rash and other nonspecific skin eruption: Secondary | ICD-10-CM

## 2020-04-10 MED ORDER — PERMETHRIN 5 % EX CREA: 1.0000 "application " | TOPICAL_CREAM | Freq: Once | CUTANEOUS | 0 refills | Status: AC

## 2020-04-10 NOTE — Progress Notes (Signed)
This visit occurred during the SARS-CoV-2 public health emergency.  Safety protocols were in place, including screening questions prior to the visit, additional usage of staff PPE, and extensive cleaning of exam room while observing appropriate contact time as indicated for disinfecting solutions.  Shingles dx'd 03/30/20.  rx'd valtrex, done with rx.  Not improved in the meantime.  Stinging pain,  B rash on abd, on back, on L thigh.  No FCNAVD.  No cough, no ST.  She had a few prev small blisters, about 46mm across.  Her dog is in/out of the house and has been itching/scratching more recently.    Patient has significant itching, worse at night.  Meds, vitals, and allergies reviewed.   ROS: Per HPI unless specifically indicated in ROS section   nad ncat Neck supple, no LA She does not have any specific dermatomal rash but she has some very small maculopapular lesions in a nondermatomal distribution scattered across her back and abdomen and some on the left upper thigh.  Some of the lesions do appear linear.  No ulceration.  No spreading erythema.

## 2020-04-10 NOTE — Patient Instructions (Addendum)
Use permethrin cream tonight, then wash off tomorrow AM.  Use claritin or calamine as needed for itching . Wash bedding and clothes tomorrow AM.  Update [place] as needed  Take care.  Glad to see you.

## 2020-04-11 DIAGNOSIS — R21 Rash and other nonspecific skin eruption: Secondary | ICD-10-CM | POA: Insufficient documentation

## 2020-04-11 NOTE — Assessment & Plan Note (Signed)
Based on previous notes it sounds like she was appropriately treated for shingles.  She has a different appearing rash in the meantime and we talked about treatment preemptively for scabies with routine cautions.  She understood.  Routine instructions given to patient.  She can use calamine lotion and Claritin for itching in the meantime.  She will update [place] as needed.  Okay for outpatient follow-up.

## 2020-05-08 ENCOUNTER — Encounter: Payer: Self-pay | Admitting: Primary Care

## 2020-05-08 ENCOUNTER — Other Ambulatory Visit: Payer: Self-pay

## 2020-05-08 ENCOUNTER — Ambulatory Visit (INDEPENDENT_AMBULATORY_CARE_PROVIDER_SITE_OTHER): Payer: BC Managed Care – PPO | Admitting: Primary Care

## 2020-05-08 VITALS — BP 118/64 | HR 74 | Temp 97.3°F | Ht 65.5 in | Wt 126.0 lb

## 2020-05-08 DIAGNOSIS — Z23 Encounter for immunization: Secondary | ICD-10-CM | POA: Diagnosis not present

## 2020-05-08 DIAGNOSIS — Z1211 Encounter for screening for malignant neoplasm of colon: Secondary | ICD-10-CM | POA: Diagnosis not present

## 2020-05-08 DIAGNOSIS — R21 Rash and other nonspecific skin eruption: Secondary | ICD-10-CM | POA: Diagnosis not present

## 2020-05-08 DIAGNOSIS — F419 Anxiety disorder, unspecified: Secondary | ICD-10-CM

## 2020-05-08 NOTE — Assessment & Plan Note (Signed)
Resolved. Likely stress induced given our conversation today. Discussed the need to work on stress reduction. Referral placed for therapy.   Also discussed use of Zyrtec if needed. She will update.

## 2020-05-08 NOTE — Patient Instructions (Signed)
You will be contacted regarding your referral to therapy and to GI for the colonoscopy.  Please let us know if you have not been contacted within two weeks.   You can try some Zyrtec at bedtime as discussed.  It was a pleasure to see you today!

## 2020-05-08 NOTE — Progress Notes (Signed)
Subjective:    Patient ID: Tara Parker, female    DOB: 28-May-1966, 54 y.o.   MRN: [id number]  HPI  This visit occurred during the SARS-CoV-2 public health emergency.  Safety protocols were in place, including screening questions prior to the visit, additional usage of staff PPE, and extensive cleaning of exam room while observing appropriate contact time as indicated for disinfecting solutions.   Tara Parker is a 54 year old female who presents today to discuss rash and anxiety. She is also due for colonoscopy.   Originally diagnosed in early September 2021 at a [facility], treated with valacyclovir course.   She was last evaluated by Dr. Para March on 04/10/20 with continued itching and pain to the rash sites, also newer rash site. Also with dog who had been scratching.  During that visit she was prescribed with Permethrin cream.  Since her most recent visit she noticed gradual relief over the following 2-3 days, but when she increased her activity level her herpes rash began to sting. Now most of her prior rash and symptoms have resolved.  Last week she woke up and noticed a red spot to the left cheek, this eventually resolved throughout the day. That evening she noticed another red spot to the forehead between her eyes. Neither spot itched or were painful.    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 She has been thinking about connecting with a therapist, doesn't have anyone as of yet. She has symptoms of chest heaviness, daily worry, some irritability. She's sleeping well.   Review of Systems  HENT: Negative for trouble swallowing.   Respiratory: Negative for shortness of breath and wheezing.   Skin: Negative for rash.       Rash resolved  Psychiatric/Behavioral: The patient is nervous/anxious.        Past Medical History:  Diagnosis Date  . Chicken pox   . Postmenopausal vaginal bleeding   . Shingles        Social History   Socioeconomic History  . Marital status: Married    Spouse name: Not on file  . Number of children: Not on file  . Years of education: Not on file  . Highest education level: Not on file  Occupational History  . Not on file  Tobacco Use  . Smoking status: Never Smoker  . Smokeless tobacco: Never Used  Vaping Use  . Vaping Use: Never used  Substance and Sexual Activity  . Alcohol use: No    Alcohol/week: 0.0 standard drinks  . Drug use: No  . Sexual activity: Yes    Birth control/protection: None  Other Topics Concern  . Not on file  Social History Narrative   Married.   1 child, age 94.   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   Enjoys reading, exercise, being outdoors.   Social Determinants of Health   Financial Resource Strain:   . Difficulty of Paying Living Expenses: Not on file  Food Insecurity:   . Worried About Programme researcher, broadcasting/film/video in the Last Year: Not on file  . Ran Out of Food in the Last Year: Not on file  Transportation Needs:   . Lack of Transportation (Medical): Not on file  . Lack of Transportation (Non-Medical): Not on file  Physical Activity:   . Days of Exercise per Week: Not on file  .  Minutes of Exercise per Session: Not on file  Stress:   . Feeling of Stress : Not on file  Social Connections:   . Frequency of Communication with Friends and Family: Not on file  . Frequency of Social Gatherings with Friends and Family: Not on file  . Attends Religious Services: Not on file  . Active Member of Clubs or Organizations: Not on file  . Attends Banker Meetings: Not on file  . Marital Status: Not on file  Intimate Partner Violence:   . Fear of Current or Ex-Partner: Not on file  . Emotionally Abused: Not on file  . Physically Abused: Not on file  . Sexually Abused: Not on file    Past Surgical History:  Procedure Laterality Date  . BREAST ENHANCEMENT SURGERY    .  CHOLECYSTECTOMY      Family History  Problem Relation Age of Onset  . Heart disease Mother   . Heart attack Mother   . Cancer Maternal Grandmother 27       Uterine    Allergies  Allergen Reactions  . Penicillins     Current Outpatient Medications on File Prior to Visit  Medication Sig Dispense Refill  . Ascorbic Acid (VITAMIN C) 1000 MG tablet Take 1,000 mg by mouth daily.    . cholecalciferol (VITAMIN D3) 25 MCG (1000 UT) tablet Take 1,000 Units by mouth daily.    Marland Kitchen estradiol (ESTRACE) 0.5 MG tablet TAKE 1 TABLET BY MOUTH EVERY DAY 90 tablet 1  . Multiple Vitamin (MULTIVITAMIN) capsule Take 1 capsule by mouth daily.    . progesterone (PROMETRIUM) 100 MG capsule TAKE 1 CAPSULE ON DAY 14-25 MONTHLY 36 capsule 3   No current facility-administered medications on file prior to visit.    BP 118/64   Pulse 74   Temp (!) 97.3 F (36.3 C) (Temporal)   Ht 5' 5.5" (1.664 m)   Wt 126 lb (57.2 kg)   LMP 12/03/2014   SpO2 100%   BMI 20.65 kg/m    Objective:   Physical Exam Cardiovascular:     Rate and Rhythm: Normal rate and regular rhythm.  Pulmonary:     Effort: Pulmonary effort is normal.     Breath sounds: Normal breath sounds.  Musculoskeletal:     Cervical back: Neck supple.  Skin:    General: Skin is warm and dry.     Findings: No rash.            Assessment & Plan:

## 2020-05-08 NOTE — Assessment & Plan Note (Signed)
Chronic for years, overall manageable except for recently. GAD 7 score of 14 today, referral placed for therapy.

## 2020-05-17 ENCOUNTER — Telehealth: Payer: BC Managed Care – PPO

## 2020-05-24 ENCOUNTER — Ambulatory Visit (INDEPENDENT_AMBULATORY_CARE_PROVIDER_SITE_OTHER): Payer: BC Managed Care – PPO | Admitting: Psychology

## 2020-05-24 DIAGNOSIS — F411 Generalized anxiety disorder: Secondary | ICD-10-CM | POA: Diagnosis not present

## 2020-06-11 ENCOUNTER — Ambulatory Visit (INDEPENDENT_AMBULATORY_CARE_PROVIDER_SITE_OTHER): Payer: BC Managed Care – PPO | Admitting: Psychology

## 2020-06-11 DIAGNOSIS — F411 Generalized anxiety disorder: Secondary | ICD-10-CM | POA: Diagnosis not present

## 2020-06-26 ENCOUNTER — Ambulatory Visit: Payer: BC Managed Care – PPO | Admitting: Psychology

## 2020-07-25 DIAGNOSIS — Z1231 Encounter for screening mammogram for malignant neoplasm of breast: Secondary | ICD-10-CM | POA: Diagnosis not present

## 2020-07-25 LAB — HM MAMMOGRAPHY

## 2020-08-11 ENCOUNTER — Other Ambulatory Visit: Payer: Self-pay | Admitting: Family Medicine

## 2020-08-11 DIAGNOSIS — Z79890 Hormone replacement therapy (postmenopausal): Secondary | ICD-10-CM

## 2020-10-11 ENCOUNTER — Other Ambulatory Visit: Payer: Self-pay | Admitting: Family Medicine

## 2020-10-11 DIAGNOSIS — Z79890 Hormone replacement therapy (postmenopausal): Secondary | ICD-10-CM

## 2020-10-11 MED ORDER — ESTRADIOL 0.5 MG PO TABS
0.5000 mg | ORAL_TABLET | Freq: Every day | ORAL | 1 refills | Status: DC
Start: 2020-10-11 — End: 2021-04-12

## 2020-10-15 ENCOUNTER — Encounter: Payer: Self-pay | Admitting: Primary Care

## 2020-10-15 ENCOUNTER — Telehealth: Payer: Self-pay

## 2020-10-15 NOTE — Telephone Encounter (Signed)
Received letter from [facility] that they have not been able to reach patient regarding mammogram follow up needed per 07/25/2020 mammogram. I have called patient l/m to call office to see if we can help her any set up. Letter placed in your box for review.   Per 07/25/2020 results:  BREAST DENSITY: b - There are scattered areas of fibroglandular density.   FINDINGS: Incomplete evaluation as no implant displaced right cc view was obtained. No suspicious mammographic abnormality in the left breast   ASSESSMENT:  BI-RADS Category: 0-RepeatFilm : Incomplete - Need additional imaging evaluation. Technical repeat.   Recommendation Laterality: Both  Diagnostic mammogram is recommended. Right implant displaced view is needed

## 2020-10-15 NOTE — Telephone Encounter (Signed)
Received call from patient states that she did not have good experience with technician and wanted to make sure same person would not be doing. Called number on letter and spoke to  [employer]. All call backs are done at [facility]. I have made follow Mammo for 11/01/2020 at 3:10. Called patient gave time and date ok with appointment time/date. Patient approved. Per patient request I have sent my chart with all information.

## 2020-10-16 NOTE — Telephone Encounter (Signed)
Noted  

## 2021-01-31 ENCOUNTER — Other Ambulatory Visit: Payer: Self-pay | Admitting: Family Medicine

## 2021-01-31 DIAGNOSIS — Z79890 Hormone replacement therapy (postmenopausal): Secondary | ICD-10-CM

## 2021-04-12 ENCOUNTER — Other Ambulatory Visit: Payer: Self-pay | Admitting: Family Medicine

## 2021-04-12 DIAGNOSIS — Z79890 Hormone replacement therapy (postmenopausal): Secondary | ICD-10-CM

## 2021-04-19 DIAGNOSIS — D485 Neoplasm of uncertain behavior of skin: Secondary | ICD-10-CM | POA: Diagnosis not present

## 2021-04-19 DIAGNOSIS — C44519 Basal cell carcinoma of skin of other part of trunk: Secondary | ICD-10-CM | POA: Diagnosis not present

## 2021-04-19 DIAGNOSIS — L570 Actinic keratosis: Secondary | ICD-10-CM | POA: Diagnosis not present

## 2021-04-19 DIAGNOSIS — X32XXXA Exposure to sunlight, initial encounter: Secondary | ICD-10-CM | POA: Diagnosis not present

## 2021-05-28 DIAGNOSIS — Z01812 Encounter for preprocedural laboratory examination: Secondary | ICD-10-CM | POA: Diagnosis not present

## 2021-05-30 DIAGNOSIS — C44519 Basal cell carcinoma of skin of other part of trunk: Secondary | ICD-10-CM | POA: Diagnosis not present

## 2021-08-04 NOTE — Progress Notes (Signed)
Malikah Lakey T. Lynnex Fulp, MD, CAQ Sports Medicine [facility] at [facility] [address] [place], [address]  Phone: [phone]   FAX: [phone]  Tara Parker - 56 y.o. female   MRN [id number]   Date of Birth: March 12, 1966  Date: 08/05/2021   PCP: Doreene Nest, NP   Referral: Doreene Nest, NP  Chief Complaint  Patient presents with   Leg Pain    Right    This visit occurred during the SARS-CoV-2 public health emergency.  Safety protocols were in place, including screening questions prior to the visit, additional usage of staff PPE, and extensive cleaning of exam room while observing appropriate contact time as indicated for disinfecting solutions.   Subjective:   Tara Parker is a 56 y.o. very pleasant female patient with Body mass index is 21.53 kg/m. who presents with the following:  Is here to talk about some ongoing leg pain.  I did see her about 3 years ago for some iliotibial band syndrome on the right, and more distally roughly greater than 6 years ago I saw her with some patellofemoral syndrome.  Now she is having some pain on the right side and anterior groin as well as in the posterior pelvis.  There is also a lesser extent of some pain in the upper pelvis posteriorly on the right.  She also is having some pain in the thigh anteriorly and laterally.  She does have some pain with lifting and going deep.  She is able to walk, this does cause some pain.  She has noticed that she is having some pain with using an elliptical.  She has tried some topical Voltaren, this is not really helped all that much. Even with walking will hurt.   Theragun.  Does not seem to be helping. Has used a powerplate in the gym.  No prior major interventions or fractures.  Review of Systems is noted in the HPI, as appropriate  Objective:   BP 100/70    Pulse 80    Temp 97.9 F (36.6 C) (Temporal)    Ht 5' 5.5" (1.664 m)    Wt 131 lb 6 oz (59.6 kg)     LMP 12/03/2014    SpO2 97%    BMI 21.53 kg/m   GEN: No acute distress; alert,appropriate. PULM: Breathing comfortably in no respiratory distress PSYCH: Normally interactive.   No echymosis or edema Rises to examination table with no difficulty Gait: non antalgic  Inspection/Deformity: N Paraspinus Tenderness: NT  B Ankle Dorsiflexion (L5,4): 5/5 B Great Toe Dorsiflexion (L5,4): 5/5 Rise/Squat (L4): WNL  SENSORY B Medial Foot (L4): WNL B Dorsum (L5): WNL B Lateral (S1): WNL Light Touch: WNL Pinprick: WNL  REFLEXES Knee (L4): 2+ Ankle (S1): 2+  B SLR, seated: neg B SLR, supine: neg B FABER: Some anterior pain B Reverse FABER: neg B Greater Troch: NT B Log Roll: neg B Sciatic Notch: NT    HIP EXAM: SIDE: Bilateral ROM: Abduction, Flexion, Internal and External range of motion: Grossly full Pain with terminal IROM and EROM: Pain with terminal internal range of motion FADIR is positive GTB: NT SLR: NEG Knees: No effusion REVERSE FABER: NT, neg Piriformis: NT at direct palpation Str: flexion: 4/5 on the right abduction: 5/5 adduction: 5/5 Hip flexion on the right does cause some pain.    Laboratory and Imaging Data: DG HIP UNILAT WITH PELVIS 2-3 VIEWS RIGHT  Result Date: 08/05/2021 CLINICAL DATA:  Chronic hip pain. EXAM: DG  HIP (WITH OR WITHOUT PELVIS) 2-3V RIGHT COMPARISON:  None. FINDINGS: There may be medial joint space narrowing bilaterally. Superior joint space is maintained. No osteophytosis. IMPRESSION: Medial hip joint space narrowing bilaterally. Electronically Signed   By: Leanna Battles M.D.   On: 08/05/2021 15:55     Assessment and  Plan:     ICD-10-CM   1. Primary localized osteoarthritis of right hip  M16.11 DG FLUORO GUIDED NEEDLE PLC ASPIRATION/INJECTION LOC    2. Chronic right hip pain  M25.551 DG HIP UNILAT WITH PELVIS 2-3 VIEWS RIGHT   G89.29 DG FLUORO GUIDED NEEDLE PLC ASPIRATION/INJECTION LOC     There is more joint space narrowing  than anticipated, more medially, and my suspicion is that a lot of this pain is due to arthritic change.  Diagnostic and therapeutic injection of the right hip with fluoroscopy guidance of anesthetic and corticosteroid.   Recheck after  Social: She normally is quite active and works out all the time, but this is limiting her ability to exercise She is having some pain with walking.  Orders Placed This Encounter  Procedures   DG HIP UNILAT WITH PELVIS 2-3 VIEWS RIGHT   DG FLUORO GUIDED NEEDLE PLC ASPIRATION/INJECTION LOC    Follow-up: Return in about 6 weeks (around 09/16/2021).  Dragon Medical One speech-to-text software was used for transcription in this dictation.  Possible transcriptional errors can occur using Animal nutritionist.   Signed,  Elpidio Galea. Kiela Shisler, MD   Outpatient Encounter Medications as of 08/05/2021  Medication Sig   estradiol (ESTRACE) 0.5 MG tablet TAKE 1 TABLET BY MOUTH EVERY DAY   Multiple Vitamin (MULTIVITAMIN) capsule Take 1 capsule by mouth daily.   progesterone (PROMETRIUM) 100 MG capsule TAKE 1 CAPSULE ON DAY 14-25 MONTHLY   [DISCONTINUED] Ascorbic Acid (VITAMIN C) 1000 MG tablet Take 1,000 mg by mouth daily.   [DISCONTINUED] cholecalciferol (VITAMIN D3) 25 MCG (1000 UT) tablet Take 1,000 Units by mouth daily.   No facility-administered encounter medications on file as of 08/05/2021.

## 2021-08-05 ENCOUNTER — Ambulatory Visit (INDEPENDENT_AMBULATORY_CARE_PROVIDER_SITE_OTHER)
Admission: RE | Admit: 2021-08-05 | Discharge: 2021-08-05 | Disposition: A | Discharge status: Home / Self Care | Payer: BC Managed Care – PPO | Source: Ambulatory Visit | Attending: Family Medicine | Admitting: Family Medicine

## 2021-08-05 ENCOUNTER — Ambulatory Visit (INDEPENDENT_AMBULATORY_CARE_PROVIDER_SITE_OTHER): Payer: BC Managed Care – PPO | Admitting: Family Medicine

## 2021-08-05 ENCOUNTER — Encounter: Payer: Self-pay | Admitting: Family Medicine

## 2021-08-05 ENCOUNTER — Other Ambulatory Visit: Payer: Self-pay

## 2021-08-05 VITALS — BP 100/70 | HR 80 | Temp 97.9°F | Ht 65.5 in | Wt 131.4 lb

## 2021-08-05 DIAGNOSIS — G8929 Other chronic pain: Secondary | ICD-10-CM

## 2021-08-05 DIAGNOSIS — M25551 Pain in right hip: Secondary | ICD-10-CM

## 2021-08-05 DIAGNOSIS — M1611 Unilateral primary osteoarthritis, right hip: Secondary | ICD-10-CM

## 2021-08-14 ENCOUNTER — Other Ambulatory Visit: Payer: Self-pay

## 2021-08-14 ENCOUNTER — Ambulatory Visit
Admission: RE | Admit: 2021-08-14 | Discharge: 2021-08-14 | Disposition: A | Discharge status: Home / Self Care | Payer: BC Managed Care – PPO | Source: Ambulatory Visit | Attending: Family Medicine | Admitting: Family Medicine

## 2021-08-14 DIAGNOSIS — M1611 Unilateral primary osteoarthritis, right hip: Secondary | ICD-10-CM

## 2021-08-14 DIAGNOSIS — M25551 Pain in right hip: Secondary | ICD-10-CM

## 2021-08-14 MED ORDER — METHYLPREDNISOLONE ACETATE 40 MG/ML INJ SUSP (RADIOLOG
80.0000 mg | Freq: Once | INTRAMUSCULAR | Status: AC
  Administered 2021-08-14: 80 mg via INTRA_ARTICULAR

## 2021-08-14 MED ORDER — IOPAMIDOL (ISOVUE-M 300) INJECTION 61%
1.0000 mL | Freq: Once | INTRAMUSCULAR | Status: AC
  Administered 2021-08-14: 1 mL via INTRA_ARTICULAR

## 2021-09-11 ENCOUNTER — Telehealth: Payer: Self-pay | Admitting: Primary Care

## 2021-09-11 NOTE — Telephone Encounter (Signed)
Called patient l/m to call office she has not been seen by kate in over a year. Will need appointment for CPE in order to have placed. Please contact patient to make appointment

## 2021-09-11 NOTE — Telephone Encounter (Signed)
Pt scheduled for CPE on 10/22/21 at 10:20

## 2021-09-11 NOTE — Telephone Encounter (Signed)
Pt called to follow up on colonoscopy referral, that was talked about at her last appt

## 2021-09-15 NOTE — Progress Notes (Signed)
Tara Tara Parker T. [name], MD, [place] at [facility] [place], [address]  Phone: [phone]   FAX: [place] - 56 y.o. female   MRN [id number]   Date of Birth: 10-16-1965  Date: 09/16/2021   PCP: Pleas Koch, NP   Referral: Pleas Koch, NP  Chief Complaint  Patient presents with   Follow-up    Right Hip    This visit occurred during the SARS-CoV-2 public health emergency.  Safety protocols were in place, including screening questions prior to the visit, additional usage of staff PPE, and extensive cleaning of exam room while observing appropriate contact time as indicated for disinfecting solutions.   Subjective:   Tara Tara Parker is a 56 y.o. very pleasant female patient with Body mass index is 21.64 kg/m. who presents with the following:  The last time I saw the patient, she had more osteoarthritic change in the hip than I had anticipated.  I set her up for a fluoroscopy guided hip injection.  She is here today to follow-up and see how she is doing overall.  Post-injection, did have some achiness in the hip.  Light leg work-out then and in the sitting position.  Felt good for at least a week.  Friday until now.  Now she is really feeling a lot better.   Has been slowly getting back into it.   Doing some modifications in the gym, but basically able to return to activity.    08/05/2021 Last OV with Owens Loffler, MD  Is here to talk about some ongoing leg pain.  I did see her about 3 years ago for some iliotibial band syndrome on the right, and more distally roughly greater than 6 years ago I saw her with some patellofemoral syndrome.   Now she is having some pain on the right side and anterior groin as well as in the posterior pelvis.  There is also a lesser extent of some pain in the upper pelvis posteriorly on the right.  She also is having some pain in the thigh anteriorly and  laterally.  She does have some pain with lifting and going deep.  She is able to walk, this does cause some pain.  She has noticed that she is having some pain with using an elliptical.   She has tried some topical Voltaren, this is not really helped all that much. Even with walking will hurt.    Theragun.  Does not seem to be helping. Has used a powerplate in the gym.   No prior major interventions or fractures.  Review of Systems is noted in the HPI, as appropriate   Objective:   BP 102/70    Pulse 69    Temp 98.8 F (37.1 C) (Temporal)    Ht 5' 5.5" (1.664 m)    Wt 132 lb 1 oz (59.9 kg)    LMP 12/03/2014    SpO2 98%    BMI 21.64 kg/m    HIP EXAM: SIDE: R ROM: Abduction, Flexion, Internal and External range of motion: She has some modest restriction in motion only Pain with terminal IROM and EROM: At this point, there is minimal GTB: NT SLR: NEG Knees: No effusion FABER: NT REVERSE FABER: NT, neg Piriformis: NT at direct palpation Str: flexion: 5/5 abduction: 5/5 adduction: 5/5 Strength testing non-tender    Radiology: DG FLUORO GUIDED NEEDLE PLC ASPIRATION/INJECTION LOC  Result Date: 08/14/2021 CLINICAL DATA:  Right hip  and thigh discomfort. EXAM: Fluoroscopically guided right hip injection COMPARISON:  Radiography 08/05/2021 TECHNIQUE: The overlying skin was prepped with Betadine, draped in the usual sterile fashion, and infiltrated locally with 1% Lidocaine. A 22 gauge spinal needle was advanced under fluoroscopic observation to the lateral femoral neck. Confirmatory injection of less than 1 ml of Isovue-300 demonstrates intra-articular spread without intravascular component. Eighty mg Depo-Medrol and 2 cc 0.25% bupivacaine were then instilled. The procedure was well tolerated. The patient was observed for a short period of time then discharged in good condition. FLUOROSCOPY TIME:  0 minutes 18 seconds. 15.64 micro gray meter squared IMPRESSION: Technically successful  right hip injection under fluoroscopy. Electronically Signed   By: Nelson Chimes M.D.   On: 08/14/2021 12:26     Assessment and Plan:     ICD-10-CM   1. Primary localized osteoarthritis of right hip  M16.11      Relief of symptoms post diagnostic and therapeutic injection of the right hip.  Strongly indicative that this is primarily osteoarthritic.  Continue activity, follow-up only as needed.  Dragon Medical One speech-to-text software was used for transcription in this dictation.  Possible transcriptional errors can occur using Editor, commissioning.   Signed,  Maud Deed. Analysia Dungee, MD   Outpatient Encounter Medications as of 09/16/2021  Medication Sig   estradiol (ESTRACE) 0.5 MG tablet TAKE 1 TABLET BY MOUTH EVERY DAY   Multiple Vitamin (MULTIVITAMIN) capsule Take 1 capsule by mouth daily.   progesterone (PROMETRIUM) 100 MG capsule TAKE 1 CAPSULE ON DAY 14-25 MONTHLY   No facility-administered encounter medications on file as of 09/16/2021.

## 2021-09-16 ENCOUNTER — Other Ambulatory Visit: Payer: Self-pay

## 2021-09-16 ENCOUNTER — Ambulatory Visit: Payer: BC Managed Care – PPO | Admitting: Family Medicine

## 2021-09-16 ENCOUNTER — Encounter: Payer: Self-pay | Admitting: Family Medicine

## 2021-09-16 VITALS — BP 102/70 | HR 69 | Temp 98.8°F | Ht 65.5 in | Wt 132.1 lb

## 2021-09-16 DIAGNOSIS — M1611 Unilateral primary osteoarthritis, right hip: Secondary | ICD-10-CM | POA: Diagnosis not present

## 2021-10-17 ENCOUNTER — Other Ambulatory Visit: Payer: Self-pay | Admitting: Family Medicine

## 2021-10-17 DIAGNOSIS — Z79890 Hormone replacement therapy (postmenopausal): Secondary | ICD-10-CM

## 2021-10-22 ENCOUNTER — Encounter: Payer: BC Managed Care – PPO | Admitting: Primary Care

## 2021-10-24 ENCOUNTER — Other Ambulatory Visit: Payer: Self-pay | Admitting: *Deleted

## 2021-10-24 DIAGNOSIS — Z79890 Hormone replacement therapy (postmenopausal): Secondary | ICD-10-CM

## 2021-10-24 MED ORDER — ESTRADIOL 0.5 MG PO TABS: 0.5000 mg | ORAL_TABLET | Freq: Every day | ORAL | 0 refills | Status: DC

## 2021-11-05 ENCOUNTER — Encounter: Payer: Self-pay | Admitting: Primary Care

## 2021-11-05 ENCOUNTER — Ambulatory Visit (INDEPENDENT_AMBULATORY_CARE_PROVIDER_SITE_OTHER): Payer: BC Managed Care – PPO | Admitting: Primary Care

## 2021-11-05 VITALS — BP 100/60 | HR 67 | Temp 98.1°F | Ht 65.5 in | Wt 129.0 lb

## 2021-11-05 DIAGNOSIS — Z0000 Encounter for general adult medical examination without abnormal findings: Secondary | ICD-10-CM | POA: Diagnosis not present

## 2021-11-05 DIAGNOSIS — Z23 Encounter for immunization: Secondary | ICD-10-CM | POA: Diagnosis not present

## 2021-11-05 DIAGNOSIS — F419 Anxiety disorder, unspecified: Secondary | ICD-10-CM

## 2021-11-05 DIAGNOSIS — Z1231 Encounter for screening mammogram for malignant neoplasm of breast: Secondary | ICD-10-CM

## 2021-11-05 DIAGNOSIS — Z1159 Encounter for screening for other viral diseases: Secondary | ICD-10-CM

## 2021-11-05 DIAGNOSIS — N898 Other specified noninflammatory disorders of vagina: Secondary | ICD-10-CM

## 2021-11-05 DIAGNOSIS — Z1211 Encounter for screening for malignant neoplasm of colon: Secondary | ICD-10-CM

## 2021-11-05 DIAGNOSIS — Z79890 Hormone replacement therapy (postmenopausal): Secondary | ICD-10-CM | POA: Diagnosis not present

## 2021-11-05 DIAGNOSIS — Z114 Encounter for screening for human immunodeficiency virus [HIV]: Secondary | ICD-10-CM

## 2021-11-05 NOTE — Assessment & Plan Note (Signed)
Following with GYN. ? ?Continue estradiol 0.5 mg daily, progesterone 100 mg on days 14-25. ?

## 2021-11-05 NOTE — Assessment & Plan Note (Signed)
Shingrix due, first dose provided today. Tetanus vaccine UTD.  ?Pap smear due, patient will see GYN. ?Colonoscopy overdue, referral placed to GI. ? ?Commended her on a healthy diet and regular exercise ? ?Exam today stable, labs pending. ?

## 2021-11-05 NOTE — Addendum Note (Signed)
Addended by: Donnamarie Poag on: 11/05/2021 03:51 PM ? ? Modules accepted: Orders ? ?

## 2021-11-05 NOTE — Assessment & Plan Note (Signed)
Following with GYN. ? ?Continue Prometrium 100 mg daily, estradiol 0.5 mg daily. ?Pap smear due, she will have this done per GYN. ?

## 2021-11-05 NOTE — Patient Instructions (Signed)
Stop by the lab prior to leaving today. I will notify you of your results once received.  ? ?Call the [facility] to schedule your mammogram.  ? ?You will be contacted regarding your referral to GI for the colonoscopy.  Please let us know if you have not been contacted within two weeks.  ? ?It was a pleasure to see you today! ? ?Preventive Care 60-56 Years Old, Female ?Preventive care refers to lifestyle choices and visits with your health care provider that can promote health and wellness. Preventive care visits are also called wellness exams. ?What can I expect for my preventive care visit? ?Counseling ?Your health care provider may ask you questions about your: ?Medical history, including: ?Past medical problems. ?Family medical history. ?Pregnancy history. ?Current health, including: ?Menstrual cycle. ?Method of birth control. ?Emotional well-being. ?Home life and relationship well-being. ?Sexual activity and sexual health. ?Lifestyle, including: ?Alcohol, nicotine or tobacco, and drug use. ?Access to firearms. ?Diet, exercise, and sleep habits. ?Work and work Statistician. ?Sunscreen use. ?Safety issues such as seatbelt and bike helmet use. ?Physical exam ?Your health care provider will check your: ?Height and weight. These may be used to calculate your BMI (body mass index). BMI is a measurement that tells if you are at a healthy weight. ?Waist circumference. This measures the distance around your waistline. This measurement also tells if you are at a healthy weight and may help predict your risk of certain diseases, such as type 2 diabetes and high blood pressure. ?Heart rate and blood pressure. ?Body temperature. ?Skin for abnormal spots. ?What immunizations do I need? ? ?Vaccines are usually given at various ages, according to a schedule. Your health care provider will recommend vaccines for you based on your age, medical history, and lifestyle or other factors, such as travel or where you work. ?What  tests do I need? ?Screening ?Your health care provider may recommend screening tests for certain conditions. This may include: ?Lipid and cholesterol levels. ?Diabetes screening. This is done by checking your blood sugar (glucose) after you have not eaten for a while (fasting). ?Pelvic exam and Pap test. ?Hepatitis B test. ?Hepatitis C test. ?HIV (human immunodeficiency virus) test. ?STI (sexually transmitted infection) testing, if you are at risk. ?Lung cancer screening. ?Colorectal cancer screening. ?Mammogram. Talk with your health care provider about when you should start having regular mammograms. This may depend on whether you have a family history of breast cancer. ?BRCA-related cancer screening. This may be done if you have a family history of breast, ovarian, tubal, or peritoneal cancers. ?Bone density scan. This is done to screen for osteoporosis. ?Talk with your health care provider about your test results, treatment options, and if necessary, the need for more tests. ?Follow these instructions at home: ?Eating and drinking ? ?Eat a diet that includes fresh fruits and vegetables, whole grains, lean protein, and low-fat dairy products. ?Take vitamin and mineral supplements as recommended by your health care provider. ?Do not drink alcohol if: ?Your health care provider tells you not to drink. ?You are pregnant, may be pregnant, or are planning to become pregnant. ?If you drink alcohol: ?Limit how much you have to 0-1 drink a day. ?Know how much alcohol is in your drink. In the [place] one drink equals one 12 oz bottle of beer (355 mL), one 5 oz glass of wine (148 mL), or one 1? oz glass of hard liquor (44 mL). ?Lifestyle ?Brush your teeth every morning and night with fluoride toothpaste. Floss one time each  day. ?Exercise for at least 30 minutes 5 or more days each week. ?Do not use any products that contain nicotine or tobacco. These products include cigarettes, chewing tobacco, and vaping devices, such as  e-cigarettes. If you need help quitting, ask your health care provider. ?Do not use drugs. ?If you are sexually active, practice safe sex. Use a condom or other form of protection to prevent STIs. ?If you do not wish to become pregnant, use a form of birth control. If you plan to become pregnant, see your health care provider for a prepregnancy visit. ?Take aspirin only as told by your health care provider. Make sure that you understand how much to take and what form to take. Work with your health care provider to find out whether it is safe and beneficial for you to take aspirin daily. ?Find healthy ways to manage stress, such as: ?Meditation, yoga, or listening to music. ?Journaling. ?Talking to a trusted person. ?Spending time with friends and family. ?Minimize exposure to UV radiation to reduce your risk of skin cancer. ?Safety ?Always wear your seat belt while driving or riding in a vehicle. ?Do not drive: ?If you have been drinking alcohol. Do not ride with someone who has been drinking. ?When you are tired or distracted. ?While texting. ?If you have been using any mind-altering substances or drugs. ?Wear a helmet and other protective equipment during sports activities. ?If you have firearms in your house, make sure you follow all gun safety procedures. ?Seek help if you have been physically or sexually abused. ?What's next? ?Visit your health care provider once a year for an annual wellness visit. ?Ask your health care provider how often you should have your eyes and teeth checked. ?Stay up to date on all vaccines. ?This information is not intended to replace advice given to you by your health care provider. Make sure you discuss any questions you have with your health care provider. ?Document Revised: 01/02/2021 Document Reviewed: 01/02/2021 ?Elsevier Patient Education ? 2023 Elsevier Inc. ? ?

## 2021-11-05 NOTE — Assessment & Plan Note (Signed)
Stable.  No concerns today. Continue to monitor.  

## 2021-11-05 NOTE — Progress Notes (Signed)
? ?Subjective:  ? ? Patient ID: Tara Parker, female    DOB: 1965/08/04, 56 y.o.   MRN: [id number] ? ?HPI ? ?Tara Parker is a very pleasant 56 y.o. female who presents today for complete physical and follow up of chronic conditions. ? ?Immunizations: ?-Tetanus: 2018 ?-Influenza: Did not complete last season ?-Covid-19: Has not completed ?-Shingles: Never completed ? ?Diet: Healthy diet ?Exercise: Regular exercise. ? ?Eye exam: Completes annually  ?Dental exam: Completes semi-annually  ? ?Pap Smear: Completed in February 2020, follows with GYN.  ?Mammogram: Completed in January 2022 ?Colonoscopy: Never completed ? ?BP Readings from Last 3 Encounters:  ?11/05/21 100/60  ?09/16/21 102/70  ?08/05/21 100/70  ? ? ? ? ? ? ? ?Review of Systems  ?Constitutional:  Negative for unexpected weight change.  ?HENT:  Negative for rhinorrhea.   ?Eyes:  Negative for visual disturbance.  ?Respiratory:  Negative for cough and shortness of breath.   ?Cardiovascular:  Negative for chest pain.  ?Gastrointestinal:  Negative for constipation and diarrhea.  ?Genitourinary:  Negative for difficulty urinating.  ?Musculoskeletal:  Negative for arthralgias and myalgias.  ?Skin:  Negative for rash.  ?Allergic/Immunologic: Negative for environmental allergies.  ?Neurological:  Negative for dizziness and headaches.  ?Psychiatric/Behavioral:  The patient is not nervous/anxious.   ? ?   ? ? ?Past Medical History:  ?Diagnosis Date  ? Chicken pox   ? Postmenopausal vaginal bleeding   ? Shingles   ? ? ?Social History  ? ?Socioeconomic History  ? Marital status: Married  ?  Spouse name: Not on file  ? Number of children: Not on file  ? Years of education: Not on file  ? Highest education level: Not on file  ?Occupational History  ? Not on file  ?Tobacco Use  ? Smoking status: Never  ? Smokeless tobacco: Never  ?Vaping Use  ? Vaping Use: Never used  ?Substance and Sexual Activity  ? Alcohol use: No  ?  Alcohol/week: 0.0 standard drinks  ? Drug use: No   ? Sexual activity: Yes  ?  Birth control/protection: None  ?Other Topics Concern  ? Not on file  ?Social History Narrative  ? Married.  ? 1 child, age 23.  ? Moved to [place] from [place], [place]  ? 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  ? Enjoys reading, exercise, being outdoors.  ? ?Social Determinants of Health  ? ?Financial Resource Strain: Not on file  ?Food Insecurity: Not on file  ?Transportation Needs: Not on file  ?Physical Activity: Not on file  ?Stress: Not on file  ?Social Connections: Not on file  ?Intimate Partner Violence: Not on file  ? ? ?Past Surgical History:  ?Procedure Laterality Date  ? BREAST ENHANCEMENT SURGERY    ? CHOLECYSTECTOMY    ? ? ?Family History  ?Problem Relation Age of Onset  ? Heart disease Mother   ? Heart attack Mother   ? Cancer Maternal Grandmother 25  ?     Uterine  ? ? ?Allergies  ?Allergen Reactions  ? Penicillins   ? ? ?Current Outpatient Medications on File Prior to Visit  ?Medication Sig Dispense Refill  ? estradiol (ESTRACE) 0.5 MG tablet Take 1 tablet (0.5 mg total) by mouth daily. 30 tablet 0  ? Multiple Vitamin (MULTIVITAMIN) capsule Take 1 capsule by mouth daily.    ? progesterone (PROMETRIUM) 100 MG capsule TAKE 1 CAPSULE ON DAY 14-25 MONTHLY 36 capsule 3  ? ?No current facility-administered medications on file prior to visit.  ? ? ?  BP 100/60   Pulse 67   Temp 98.1 ?F (36.7 ?C) (Oral)   Ht 5' 5.5" (1.664 m)   Wt 129 lb (58.5 kg)   LMP 12/03/2014   SpO2 98%   BMI 21.14 kg/m?  ?Objective:  ? Physical Exam ?HENT:  ?   Right Ear: Tympanic membrane and ear canal normal.  ?   Left Ear: Tympanic membrane and ear canal normal.  ?   Nose: Nose normal.  ?Eyes:  ?   Conjunctiva/sclera: Conjunctivae normal.  ?   Pupils: Pupils are equal, round, and reactive to light.  ?Neck:  ?   Thyroid: No thyromegaly.  ?Cardiovascular:  ?   Rate and Rhythm: Normal rate and regular rhythm.  ?   Heart sounds: No murmur heard. ?Pulmonary:  ?   Effort: Pulmonary effort is normal.   ?   Breath sounds: Normal breath sounds. No rales.  ?Abdominal:  ?   General: Bowel sounds are normal.  ?   Palpations: Abdomen is soft.  ?   Tenderness: There is no abdominal tenderness.  ?Musculoskeletal:     ?   General: Normal range of motion.  ?   Cervical back: Neck supple.  ?Lymphadenopathy:  ?   Cervical: No cervical adenopathy.  ?Skin: ?   General: Skin is warm and dry.  ?   Findings: No rash.  ?Neurological:  ?   Mental Status: She is alert and oriented to person, place, and time.  ?   Cranial Nerves: No cranial nerve deficit.  ?   Deep Tendon Reflexes: Reflexes are normal and symmetric.  ?Psychiatric:     ?   Mood and Affect: Mood normal.  ? ? ? ? ? ?   ?Assessment & Plan:  ? ? ? ? ?This visit occurred during the SARS-CoV-2 public health emergency.  Safety protocols were in place, including screening questions prior to the visit, additional usage of staff PPE, and extensive cleaning of exam room while observing appropriate contact time as indicated for disinfecting solutions.  ?

## 2021-11-06 ENCOUNTER — Telehealth: Payer: Self-pay

## 2021-11-06 DIAGNOSIS — Z1211 Encounter for screening for malignant neoplasm of colon: Secondary | ICD-10-CM

## 2021-11-06 LAB — COMPREHENSIVE METABOLIC PANEL
ALT: 53 U/L — ABNORMAL HIGH (ref 0–35)
AST: 37 U/L (ref 0–37)
Albumin: 4.5 g/dL (ref 3.5–5.2)
Alkaline Phosphatase: 29 U/L — ABNORMAL LOW (ref 39–117)
BUN: 26 mg/dL — ABNORMAL HIGH (ref 6–23)
CO2: 29 mEq/L (ref 19–32)
Calcium: 9.2 mg/dL (ref 8.4–10.5)
Chloride: 101 mEq/L (ref 96–112)
Creatinine, Ser: 1.02 mg/dL (ref 0.40–1.20)
GFR: 61.81 mL/min (ref >60.00)
Glucose, Bld: 84 mg/dL (ref 70–99)
Potassium: 4.1 mEq/L (ref 3.5–5.1)
Sodium: 137 mEq/L (ref 135–145)
Total Bilirubin: 0.8 mg/dL (ref 0.2–1.2)
Total Protein: 6.7 g/dL (ref 6.0–8.3)

## 2021-11-06 LAB — LIPID PANEL
Cholesterol: 158 mg/dL (ref 0–200)
HDL: 51.4 mg/dL (ref >39.00)
LDL Cholesterol: 96 mg/dL (ref 0–99)
NonHDL: 106.33
Total CHOL/HDL Ratio: 3
Triglycerides: 50 mg/dL (ref 0.0–149.0)
VLDL: 10 mg/dL (ref 0.0–40.0)

## 2021-11-06 LAB — CBC
HCT: 40 % (ref 36.0–46.0)
Hemoglobin: 14.1 g/dL (ref 12.0–15.0)
MCHC: 35.3 g/dL (ref 30.0–36.0)
MCV: 92.5 fl (ref 78.0–100.0)
Platelets: 200 10*3/uL (ref 150.0–400.0)
RBC: 4.33 Mil/uL (ref 3.87–5.11)
RDW: 12.6 % (ref 11.5–15.5)
WBC: 6 10*3/uL (ref 4.0–10.5)

## 2021-11-06 LAB — HEPATITIS C ANTIBODY
Hepatitis C Ab: NONREACTIVE
SIGNAL TO CUT-OFF: 0.11 (ref <1.00)

## 2021-11-06 LAB — HIV ANTIBODY (ROUTINE TESTING W REFLEX): HIV 1&2 Ab, 4th Generation: NONREACTIVE

## 2021-11-06 NOTE — Telephone Encounter (Signed)
Attempted to contact patient to schedule screening colonoscopy.  Unable to lvm for her due to voicemail box is full.  Will send mychart message to notify her of colonoscopy referral. ? ?Thanks, ?Marcelino Duster, CMA ?

## 2021-11-07 ENCOUNTER — Telehealth: Payer: Self-pay

## 2021-11-07 NOTE — Telephone Encounter (Signed)
Called no answer voicemail was full letter sent  ?

## 2021-11-18 ENCOUNTER — Other Ambulatory Visit: Payer: Self-pay | Admitting: Family Medicine

## 2021-11-18 DIAGNOSIS — Z79890 Hormone replacement therapy (postmenopausal): Secondary | ICD-10-CM

## 2021-11-27 ENCOUNTER — Other Ambulatory Visit (HOSPITAL_COMMUNITY)
Admission: RE | Admit: 2021-11-27 | Discharge: 2021-11-27 | Disposition: A | Discharge status: Home / Self Care | Payer: BC Managed Care – PPO | Source: Ambulatory Visit | Attending: Family Medicine | Admitting: Family Medicine

## 2021-11-27 ENCOUNTER — Ambulatory Visit (INDEPENDENT_AMBULATORY_CARE_PROVIDER_SITE_OTHER): Payer: BC Managed Care – PPO | Admitting: Family Medicine

## 2021-11-27 ENCOUNTER — Encounter: Payer: Self-pay | Admitting: Family Medicine

## 2021-11-27 VITALS — BP 108/62 | HR 81 | Wt 125.0 lb

## 2021-11-27 DIAGNOSIS — Z124 Encounter for screening for malignant neoplasm of cervix: Secondary | ICD-10-CM | POA: Diagnosis present

## 2021-11-27 DIAGNOSIS — Z01419 Encounter for gynecological examination (general) (routine) without abnormal findings: Secondary | ICD-10-CM | POA: Diagnosis not present

## 2021-11-27 DIAGNOSIS — Z79890 Hormone replacement therapy (postmenopausal): Secondary | ICD-10-CM | POA: Diagnosis not present

## 2021-11-27 MED ORDER — ESTRADIOL 0.5 MG PO TABS: 0.5000 mg | ORAL_TABLET | Freq: Every day | ORAL | 3 refills | Status: DC

## 2021-11-27 MED ORDER — PROGESTERONE MICRONIZED 100 MG PO CAPS: 100.0000 mg | ORAL_CAPSULE | Freq: Every day | ORAL | 3 refills | Status: DC

## 2021-11-27 NOTE — Assessment & Plan Note (Signed)
[address]204 - daily estrogen at 0.5 mg and cyclic progesterone day 14-25 monthly. ?

## 2021-11-27 NOTE — Progress Notes (Signed)
Patient here to establish care. Last seen here in 2020. ?Needs refill on estradiol. ? ?LMP: Post Menopausal  ?Last Mammogram: 07/2020 ?Last Pap:08/23/2018 WNL  ?Contraception:no method ?STD Screening:No ?Family Hx of Breast Cancer:No ?Family Hx of Ovarian Cancer:No ? ?CC: None   ?

## 2021-11-27 NOTE — Progress Notes (Signed)
Subjective:  ?  ? Tara Parker is a 56 y.o. female and is here for a comprehensive physical exam. The patient reports no problems.On her HRT x 9 years now, going well. No bleeding. ? ?The following portions of the patient's history were reviewed and updated as appropriate: allergies, current medications, past family history, past medical history, past social history, past surgical history, and problem list. ? ?Review of Systems ?Pertinent items noted in HPI and remainder of comprehensive ROS otherwise negative.  ? ?Objective:  ? ? BP 108/62   Pulse 81   Wt 125 lb (56.7 kg)   LMP 12/03/2014   BMI 20.48 kg/m?  ?General appearance: alert, cooperative, and appears stated age ?Head: Normocephalic, without obvious abnormality, atraumatic ?Neck: no adenopathy, supple, symmetrical, trachea midline, and thyroid not enlarged, symmetric, no tenderness/mass/nodules ?Lungs: clear to auscultation bilaterally ?Breasts: normal appearance, no masses or tenderness, bilateral implants ?Heart: regular rate and rhythm, S1, S2 normal, no murmur, click, rub or gallop ?Abdomen: soft, non-tender; bowel sounds normal; no masses,  no organomegaly ?Pelvic: cervix normal in appearance, external genitalia normal, no adnexal masses or tenderness, no cervical motion tenderness, uterus normal size, shape, and consistency, and vagina normal without discharge ?Extremities: extremities normal, atraumatic, no cyanosis or edema ?Pulses: 2+ and symmetric ?Skin: Skin color, texture, turgor normal. No rashes or lesions ?Lymph nodes: Cervical, supraclavicular, and axillary nodes normal. ?Neurologic: Grossly normal  ?  ?Assessment:  ? ? GYN female exam.    ?  ?Plan:  ? ?Problem List Items Addressed This Visit   ? ?  ? Unprioritized  ? Hormone replacement therapy  ?  29924 - daily estrogen at 0.5 mg and cyclic progesterone day 14-25 monthly. ? ?  ?  ? Relevant Medications  ? estradiol (ESTRACE) 0.5 MG tablet  ? progesterone (PROMETRIUM) 100 MG capsule   ? ?Other Visit Diagnoses   ? ? Encounter for gynecological examination without abnormal finding    -  Primary  ? 26834  ? Relevant Orders  ? MM 3D SCREEN BREAST BILATERAL  ? Screening for cervical cancer      ? 19622  ? Relevant Orders  ? Cytology - PAP  ? ?  ? ?Return in 1 year (on 11/28/2022). ? ?  ?See After Visit Summary for Counseling Recommendations  ? ?

## 2021-11-29 LAB — CYTOLOGY - PAP
Comment: NEGATIVE
Diagnosis: NEGATIVE
High risk HPV: NEGATIVE

## 2022-01-06 ENCOUNTER — Telehealth: Payer: Self-pay

## 2022-01-06 NOTE — Telephone Encounter (Signed)
Patient is calling in stating that she seen in Dr.Copland in February for hip pain, and was referred to [facility] for an injection. [place] said that the injection is wearing off and is in a lot of pain and wanted to know if we can place the order for to get another one scheduled. Advised that both Jae Dire and Dr.Copland are off this week, offered an appointment but declined. Usha states she would contact [facility] or [facility] to see if they can do the injection without having [place] place another order.

## 2022-01-08 ENCOUNTER — Other Ambulatory Visit: Payer: Self-pay

## 2022-01-08 ENCOUNTER — Telehealth: Payer: Self-pay

## 2022-01-08 DIAGNOSIS — Z1211 Encounter for screening for malignant neoplasm of colon: Secondary | ICD-10-CM

## 2022-01-08 MED ORDER — NA SULFATE-K SULFATE-MG SULF 17.5-3.13-1.6 GM/177ML PO SOLN: 1.0000 | Freq: Once | ORAL | 0 refills | Status: DC

## 2022-01-08 MED ORDER — PEG 3350-KCL-NA BICARB-NACL 420 G PO SOLR: 4000.0000 mL | Freq: Once | ORAL | 0 refills | Status: AC

## 2022-01-08 NOTE — Telephone Encounter (Signed)
Gastroenterology Pre-Procedure Review  Request Date: 03/28/22 Requesting Physician: Dr. Allegra Lai  PATIENT REVIEW QUESTIONS: The patient responded to the following health history questions as indicated:    1. Are you having any GI issues? no 2. Do you have a personal history of Polyps? no 3. Do you have a family history of Colon Cancer or Polyps? no 4. Diabetes Mellitus? no 5. Joint replacements in the past 12 months?no 6. Major health problems in the past 3 months?no 7. Any artificial heart valves, MVP, or defibrillator?no    MEDICATIONS & ALLERGIES:    Patient reports the following regarding taking any anticoagulation/antiplatelet therapy:   Plavix, Coumadin, Eliquis, Xarelto, Lovenox, Pradaxa, Brilinta, or Effient? no Aspirin? no  Patient confirms/reports the following medications:  Current Outpatient Medications  Medication Sig Dispense Refill   estradiol (ESTRACE) 0.5 MG tablet Take 1 tablet (0.5 mg total) by mouth daily. 90 tablet 3   Multiple Vitamin (MULTIVITAMIN) capsule Take 1 capsule by mouth daily.     progesterone (PROMETRIUM) 100 MG capsule Take 1 capsule (100 mg total) by mouth daily. Take 14-25 every month 36 capsule 3   No current facility-administered medications for this visit.    Patient confirms/reports the following allergies:  Allergies  Allergen Reactions   Penicillins     No orders of the defined types were placed in this encounter.   AUTHORIZATION INFORMATION Primary Insurance: 1D#: Group #:  Secondary Insurance: 1D#: Group #:  SCHEDULE INFORMATION: Date: 03/28/22 Time: Location: [facility]

## 2022-02-05 ENCOUNTER — Telehealth: Payer: Self-pay | Admitting: Primary Care

## 2022-02-05 DIAGNOSIS — M1611 Unilateral primary osteoarthritis, right hip: Secondary | ICD-10-CM

## 2022-02-05 NOTE — Telephone Encounter (Signed)
Tara Parker called in stating she seen [name] for her hip and he referred her to [facility] to get a steroid injection. Tara Parker is wanting to get another referral sent over so she can get another injection. Patient stated that last injection wore off after about 5 1/2 months. She did reach out to [facility] and they stated she needed another referral in order to get another injection. Please advice. Thank  you.

## 2022-02-06 NOTE — Telephone Encounter (Signed)
Olegario Messier notified by telephone that referral has been placed by [name] as requested.

## 2022-02-06 NOTE — Telephone Encounter (Signed)
Ok, order placed.

## 2022-02-06 NOTE — Addendum Note (Signed)
Addended by: Hannah Beat on: 02/06/2022 02:18 PM   Modules accepted: Orders

## 2022-02-12 ENCOUNTER — Other Ambulatory Visit: Payer: BC Managed Care – PPO

## 2022-02-19 ENCOUNTER — Ambulatory Visit
Admission: RE | Admit: 2022-02-19 | Discharge: 2022-02-19 | Disposition: A | Discharge status: Home / Self Care | Payer: BC Managed Care – PPO | Source: Ambulatory Visit | Attending: Family Medicine | Admitting: Family Medicine

## 2022-02-19 DIAGNOSIS — M1611 Unilateral primary osteoarthritis, right hip: Secondary | ICD-10-CM

## 2022-02-19 MED ORDER — METHYLPREDNISOLONE ACETATE 40 MG/ML INJ SUSP (RADIOLOG
80.0000 mg | Freq: Once | INTRAMUSCULAR | Status: AC
  Administered 2022-02-19: 80 mg via INTRA_ARTICULAR

## 2022-02-19 MED ORDER — IOPAMIDOL (ISOVUE-M 200) INJECTION 41%
1.0000 mL | Freq: Once | INTRAMUSCULAR | Status: AC
  Administered 2022-02-19: 1 mL via INTRA_ARTICULAR

## 2022-03-22 ENCOUNTER — Emergency Department: Payer: BC Managed Care – PPO

## 2022-03-22 ENCOUNTER — Other Ambulatory Visit: Payer: Self-pay

## 2022-03-22 ENCOUNTER — Emergency Department
Admission: EM | Admit: 2022-03-22 | Discharge: 2022-03-22 | Disposition: A | Discharge status: Home / Self Care | Payer: BC Managed Care – PPO | Attending: Emergency Medicine | Admitting: Emergency Medicine

## 2022-03-22 DIAGNOSIS — R110 Nausea: Secondary | ICD-10-CM | POA: Diagnosis not present

## 2022-03-22 DIAGNOSIS — S0990XA Unspecified injury of head, initial encounter: Secondary | ICD-10-CM | POA: Diagnosis present

## 2022-03-22 DIAGNOSIS — S060XAA Concussion with loss of consciousness status unknown, initial encounter: Secondary | ICD-10-CM | POA: Insufficient documentation

## 2022-03-22 DIAGNOSIS — W01198A Fall on same level from slipping, tripping and stumbling with subsequent striking against other object, initial encounter: Secondary | ICD-10-CM | POA: Diagnosis not present

## 2022-03-22 DIAGNOSIS — S060X9A Concussion with loss of consciousness of unspecified duration, initial encounter: Secondary | ICD-10-CM

## 2022-03-22 DIAGNOSIS — R55 Syncope and collapse: Secondary | ICD-10-CM | POA: Diagnosis not present

## 2022-03-22 LAB — COMPREHENSIVE METABOLIC PANEL
ALT: 33 U/L (ref 0–44)
AST: 35 U/L (ref 15–41)
Albumin: 4.2 g/dL (ref 3.5–5.0)
Alkaline Phosphatase: 29 U/L — ABNORMAL LOW (ref 38–126)
Anion gap: 6 (ref 5–15)
BUN: 17 mg/dL (ref 6–20)
CO2: 26 mmol/L (ref 22–32)
Calcium: 8.7 mg/dL — ABNORMAL LOW (ref 8.9–10.3)
Chloride: 108 mmol/L (ref 98–111)
Creatinine, Ser: 0.72 mg/dL (ref 0.44–1.00)
GFR, Estimated: >60 mL/min (ref >60)
Glucose, Bld: 149 mg/dL — ABNORMAL HIGH (ref 70–99)
Potassium: 3.6 mmol/L (ref 3.5–5.1)
Sodium: 140 mmol/L (ref 135–145)
Total Bilirubin: 1 mg/dL (ref 0.3–1.2)
Total Protein: 6.9 g/dL (ref 6.5–8.1)

## 2022-03-22 LAB — CBC WITH DIFFERENTIAL/PLATELET
Abs Immature Granulocytes: 0.03 10*3/uL (ref 0.00–0.07)
Basophils Absolute: 0 10*3/uL (ref 0.0–0.1)
Basophils Relative: 1 %
Eosinophils Absolute: 0 10*3/uL (ref 0.0–0.5)
Eosinophils Relative: 0 %
HCT: 44.4 % (ref 36.0–46.0)
Hemoglobin: 15.1 g/dL — ABNORMAL HIGH (ref 12.0–15.0)
Immature Granulocytes: 1 %
Lymphocytes Relative: 26 %
Lymphs Abs: 1.5 10*3/uL (ref 0.7–4.0)
MCH: 32.2 pg (ref 26.0–34.0)
MCHC: 34 g/dL (ref 30.0–36.0)
MCV: 94.7 fL (ref 80.0–100.0)
Monocytes Absolute: 0.9 10*3/uL (ref 0.1–1.0)
Monocytes Relative: 17 %
Neutro Abs: 3.1 10*3/uL (ref 1.7–7.7)
Neutrophils Relative %: 55 %
Platelets: 143 10*3/uL — ABNORMAL LOW (ref 150–400)
RBC: 4.69 MIL/uL (ref 3.87–5.11)
RDW: 11.2 % — ABNORMAL LOW (ref 11.5–15.5)
WBC: 5.5 10*3/uL (ref 4.0–10.5)
nRBC: 0 % (ref 0.0–0.2)

## 2022-03-22 MED ORDER — ONDANSETRON 4 MG PO TBDP: 4.0000 mg | ORAL_TABLET | Freq: Once | ORAL | Status: AC

## 2022-03-22 MED ORDER — MECLIZINE HCL 25 MG PO TABS: 25.0000 mg | ORAL_TABLET | Freq: Three times a day (TID) | ORAL | 0 refills | Status: DC | PRN

## 2022-03-22 MED ORDER — ONDANSETRON 4 MG PO TBDP: 4.0000 mg | ORAL_TABLET | Freq: Three times a day (TID) | ORAL | 0 refills | Status: DC | PRN

## 2022-03-22 MED ORDER — MECLIZINE HCL 25 MG PO TABS
25.0000 mg | ORAL_TABLET | Freq: Once | ORAL | Status: AC
  Administered 2022-03-22: 25 mg via ORAL
  Filled 2022-03-22: qty 1

## 2022-03-22 MED ORDER — SODIUM CHLORIDE 0.9 % IV BOLUS
1000.0000 mL | Freq: Once | INTRAVENOUS | Status: AC
  Administered 2022-03-22: 1000 mL via INTRAVENOUS

## 2022-03-22 MED ORDER — ONDANSETRON 4 MG PO TBDP
ORAL_TABLET | ORAL | Status: AC
  Administered 2022-03-22: 4 mg via ORAL
  Filled 2022-03-22: qty 1

## 2022-03-22 MED ORDER — ONDANSETRON HCL 4 MG/2ML IJ SOLN
4.0000 mg | Freq: Once | INTRAMUSCULAR | Status: AC
  Administered 2022-03-22: 4 mg via INTRAVENOUS

## 2022-03-22 MED ORDER — ONDANSETRON HCL 4 MG/2ML IJ SOLN
4.0000 mg | Freq: Once | INTRAMUSCULAR | Status: DC
  Filled 2022-03-22: qty 2

## 2022-03-22 NOTE — ED Triage Notes (Addendum)
Pt presents to ED with c/o of falling and hitting her head this morning at 0400 this morning. Pt states + LOC. Pt states she hit her on ceramic tile floor.   Pt states she feels like the room is spinning and is nauseous, pt denies HX of vertigo.   Pt actively vomting in triage.

## 2022-03-22 NOTE — Discharge Instructions (Addendum)
Surgery plenty of fluids and be careful when you are moving from lying down to sitting or standing.  Follow-up with your regular doctor in 1 to 2 weeks.  Return to the ER for new, worsening, or persistent severe dizziness or lightheadedness, severe headache, vomiting, weakness, or any other new or worsening symptoms that concern you.

## 2022-03-22 NOTE — ED Provider Notes (Signed)
[facility] Provider Note    Event Date/Time   First MD Initiated Contact with Patient 03/22/22 1031     (approximate)   History   No chief complaint on file.   HPI  Tara Parker is a 56 y.o. female with no active medical problems who presents with dizziness and nausea after a fall.  The patient states that she has had a head cold for the last few days.  During the night she got up to go to the bathroom, suddenly felt lightheaded, and then heard a cracking sound.  She then awoke and found herself on the floor.  She appears to have passed out and hit her head.  She states that since that time she has had dizziness which she describes as a sensation of vertigo and spinning.  It is relieved when she is lying flat but exacerbated when she starts to move around or turn her head.  It is associated with nausea.  The patient denies significant headache.  She has no chest pain or difficulty breathing.  She has no fever or chills.  She denies any diarrhea or abdominal pain.    Physical Exam   Triage Vital Signs: ED Triage Vitals  Enc Vitals Group     BP 03/22/22 0953 (!) 101/58     Pulse Rate 03/22/22 0950 75     Resp 03/22/22 0950 17     Temp 03/22/22 0950 98.2 F (36.8 C)     Temp Source 03/22/22 0950 Oral     SpO2 03/22/22 0950 99 %     Weight --      Height --      Head Circumference --      Peak Flow --      Pain Score 03/22/22 0950 6     Pain Loc --      Pain Edu? --      Excl. in GC? --     Most recent vital signs: Vitals:   03/22/22 0953 03/22/22 1230  BP: (!) 101/58 (!) 95/58  Pulse:  65  Resp:  14  Temp:    SpO2:  100%    General: Alert and oriented, comfortable appearing. CV:  Good peripheral perfusion.  Resp:  Normal effort.  Abd:  No distention.  Other:  EOMI.  PERRLA.  No photophobia.  No nystagmus.  No facial droop.  Cranial nerves III through XII grossly intact.  Motor and sensory intact in all extremities.  No ataxia on  finger-to-nose.  No pronator drift.   ED Results / Procedures / Treatments   Labs (all labs ordered are listed, but only abnormal results are displayed) Labs Reviewed  COMPREHENSIVE METABOLIC PANEL - Abnormal; Notable for the following components:      Result Value   Glucose, Bld 149 (*)    Calcium 8.7 (*)    Alkaline Phosphatase 29 (*)    All other components within normal limits  CBC WITH DIFFERENTIAL/PLATELET - Abnormal; Notable for the following components:   Hemoglobin 15.1 (*)    RDW 11.2 (*)    Platelets 143 (*)    All other components within normal limits     EKG  ED ECG REPORT I, Dionne Bucy, the attending physician, personally viewed and interpreted this ECG.  Date: 03/22/2022 EKG Time: 0958 Rate: 76 Rhythm: normal sinus rhythm QRS Axis: normal Intervals: normal ST/T Wave abnormalities: normal Narrative Interpretation: no evidence of acute ischemia   RADIOLOGY  CT head: I independently  viewed and interpreted the images; there is no ICH or evidence of acute trauma  PROCEDURES:  Critical Care performed: No  Procedures   MEDICATIONS ORDERED IN ED: Medications  ondansetron (ZOFRAN-ODT) disintegrating tablet 4 mg (4 mg Oral Given 03/22/22 1009)  sodium chloride 0.9 % bolus 1,000 mL (1,000 mLs Intravenous New Bag/Given 03/22/22 1127)  meclizine (ANTIVERT) tablet 25 mg (25 mg Oral Given 03/22/22 1124)  ondansetron (ZOFRAN) injection 4 mg (4 mg Intravenous Given 03/22/22 1123)     IMPRESSION / MDM / ASSESSMENT AND PLAN / ED COURSE  I reviewed the triage vital signs and the nursing notes.  56 year old female with PMH as noted above presents with apparent syncope and head injury early this morning around 6 AM.  The patient endorses URI symptoms for the last few days and had a prodrome of lightheadedness before she passed out.  She currently has vertigo when she tries to move around.  On exam the patient is well-appearing.  Her blood pressure is borderline  low but other vital signs are normal.  Neurologic exam is normal.  Initial work-up is reassuring.  CT head is negative.  EKG shows no acute abnormality.  Basic labs are unremarkable with no leukocytosis, anemia, or electrolyte abnormalities, although the patient appears possibly slightly hemoconcentrated.  In terms of the head injury, presentation is consistent with concussion.  Differential diagnosis for this syncope includes, but is not limited to, vasovagal episode, orthostatic hypotension, symptoms related to her URI/likely viral infection, dehydration/hypovolemia.  There is no evidence of cardiac etiology.  Patient's presentation is most consistent with acute presentation with potential threat to life or bodily function.  We will give a fluid bolus, symptomatic treatment with Zofran and meclizine, and reassess.  The patient is on the cardiac monitor to evaluate for evidence of arrhythmia and/or significant heart rate changes.  ----------------------------------------- 12:49 PM on 03/22/2022 -----------------------------------------  The patient is feeling significantly better after fluids and medication.  She was able to get up to go the bathroom and felt mild lightheadedness but much better than previously.  Her blood pressure is borderline low although she appears well perfused, and this is likely due to her lying flat for the last several hours.  At this time, the patient feels comfortable going home.  She is stable for discharge.  I counseled her on the results of the work-up and plan of care.  I gave her strict return precautions and she expressed understanding.   FINAL CLINICAL IMPRESSION(S) / ED DIAGNOSES   Final diagnoses:  Vasovagal syncope  Concussion with loss of consciousness, initial encounter     Rx / DC Orders   ED Discharge Orders          Ordered    meclizine (ANTIVERT) 25 MG tablet  3 times daily PRN        03/22/22 1248    ondansetron (ZOFRAN-ODT) 4 MG  disintegrating tablet  Every 8 hours PRN        03/22/22 1248             Note:  This document was prepared using Dragon voice recognition software and may include unintentional dictation errors.    Dionne Bucy, MD 03/22/22 1250

## 2022-03-22 NOTE — ED Notes (Signed)
Pt ambulatory to restroom with assistance x1. Pt with steady but slow gate noted.

## 2022-03-25 ENCOUNTER — Telehealth: Payer: Self-pay | Admitting: Gastroenterology

## 2022-03-25 NOTE — Telephone Encounter (Signed)
Pt called to cancel procedure for 03/28/2022 will call back to resched

## 2022-03-26 ENCOUNTER — Telehealth: Payer: Self-pay

## 2022-03-26 NOTE — Telephone Encounter (Signed)
Returned patients phone call to reschedule her 03/28/22 colonoscopy with Dr. Allegra Lai.  LVM for her to call me back to reschedule.  Will await call back before canceling.  Thanks, [place], [place]

## 2022-03-27 ENCOUNTER — Ambulatory Visit (INDEPENDENT_AMBULATORY_CARE_PROVIDER_SITE_OTHER): Payer: BC Managed Care – PPO | Admitting: Family Medicine

## 2022-03-27 ENCOUNTER — Encounter: Payer: Self-pay | Admitting: Family Medicine

## 2022-03-27 VITALS — BP 100/62 | HR 68 | Temp 97.8°F | Ht 65.5 in | Wt 126.1 lb

## 2022-03-27 DIAGNOSIS — S060X9A Concussion with loss of consciousness of unspecified duration, initial encounter: Secondary | ICD-10-CM

## 2022-03-27 DIAGNOSIS — S060X1A Concussion with loss of consciousness of 30 minutes or less, initial encounter: Secondary | ICD-10-CM

## 2022-03-27 NOTE — Progress Notes (Signed)
Tara Parker T. Tara Rishel, MD, CAQ Sports Medicine [facility] at [facility] [address] [place], [address]  Phone: [phone]  FAX: [phone]  Tara Parker - 56 y.o. female  MRN [id number]  Date of Birth: 07-12-1966  Date: 03/27/2022  PCP: Doreene Nest, NP  Referral: Doreene Nest, NP  Chief Complaint  Patient presents with   Follow-up    [facility]-ER F/U Vasovagal Syncope   Subjective:   Tara Parker is a 56 y.o. very pleasant female patient with Body mass index is 20.67 kg/m. who presents with the following:  She is a very pleasant 56 year old patient not seen before for various other musculoskeletal complaints who presents today after a closed head trauma obtained on March 22, 2022.  She was at home on went to the bathroom in the middle the night, and then when she came to she was awake on the floor with a headache and nauseousness.  She did hear an audible cracking sound.  She subsequently went to the emergency room on the above date, and was evaluated and felt to have a closed head injury.  She was discharged with routine concussion precautions along with meclizine and Zofran.  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  Concussed -  She continues to be quite concussed.  On symptom evaluation she endorses headache, pressure in head, neck pain, nauseousness, dizziness, balance problems sensitivity to light, sensitivity to noise, feeling slowed down, feeling like she is in a fog, feeling not right, difficulty concentrating, fatigue, drowsiness, more emotional and irritable, some sadness, anxiety and difficulty sleeping.  She reports feeling roughly 60% normal.  She is having a lot of vertigo and vestibular symptoms.  Total number of symptoms: 19/22 Symptom severity score: 35/132  Orientation: 5/5 Immediate memory 15/15 Digits backwards: 2/4 Months in reverse order 1/1  Saccades and pursuits produce  vertigo Unable to perform tandem gait  MS: Tested on [facility] floor Double leg stance 0/10 areas Single-leg stance almost immediate imbalance and stopped Tandem stance immediately stopped without closing eyes  Review of Systems is noted in the HPI, as appropriate  Objective:   BP 100/62   Pulse 68   Temp 97.8 F (36.6 C) (Oral)   Ht 5' 5.5" (1.664 m)   Wt 126 lb 2 oz (57.2 kg)   LMP 12/03/2014   SpO2 98%   BMI 20.67 kg/m   GEN: No acute distress; alert,appropriate. PULM: Breathing comfortably in no respiratory distress PSYCH: Normally interactive.  Scat 5 testing above Cranial nerves II through XII are grossly intact  Laboratory and Imaging Data:  Assessment and Plan:     ICD-10-CM   1. Concussion with loss of consciousness of 30 minutes or less, initial encounter  S06.0X1A      Total encounter time: 34 minutes. This includes total time spent on the day of encounter.    She remains quite concussed with predominantly vestibular symptoms, but she has global concussion pathology and oculomotor symptoms.  Exact timing of recovery is unclear, but for now she is going to continue complete cognitive and physical rest for at least 2 weeks.  I think she can start doing some very light walking if this does not induce symptoms.  We did go over an exercise progression, but I cautioned her very much to only proceed if she is asymptomatic entirely without exercise and with light walking.  She does do exercise training as an occupation, and she will avoid this for now.  Avoid computer work and paperwork.  She is not going to drive, and I discussed all this with her husband, as well.  She will follow-up with me in 2 weeks.  Disposition: Return in about 2 weeks (around 04/10/2022).  [employer] One speech-to-text software was used for transcription in this dictation.  Possible transcriptional errors can occur using Animal nutritionist.   Signed,  Elpidio Galea. Chanese Hartsough,  MD   Outpatient Encounter Medications as of 03/27/2022  Medication Sig   estradiol (ESTRACE) 0.5 MG tablet Take 1 tablet (0.5 mg total) by mouth daily.   meclizine (ANTIVERT) 25 MG tablet Take 1 tablet (25 mg total) by mouth 3 (three) times daily as needed for dizziness.   Multiple Vitamin (MULTIVITAMIN) capsule Take 1 capsule by mouth daily.   ondansetron (ZOFRAN-ODT) 4 MG disintegrating tablet Take 1 tablet (4 mg total) by mouth every 8 (eight) hours as needed for nausea or vomiting.   progesterone (PROMETRIUM) 100 MG capsule Take 1 capsule (100 mg total) by mouth daily. Take 14-25 every month   No facility-administered encounter medications on file as of 03/27/2022.

## 2022-03-28 ENCOUNTER — Ambulatory Visit
Admission: RE | Admit: 2022-03-28 | Payer: BC Managed Care – PPO | Source: Home / Self Care | Admitting: Gastroenterology

## 2022-03-28 ENCOUNTER — Encounter: Admission: RE | Payer: Self-pay | Source: Home / Self Care

## 2022-03-28 SURGERY — COLONOSCOPY WITH PROPOFOL
Anesthesia: General

## 2022-04-08 ENCOUNTER — Ambulatory Visit: Payer: BC Managed Care – PPO

## 2022-04-08 NOTE — Progress Notes (Unsigned)
    Tara Sigmund T. Tara Yim, MD, [place] [facility] [place], [address]  Phone: [phone]  FAX: [place] - 56 y.o. female  MRN [id number]  Date of Birth: 04/28/1966  Date: 04/09/2022  PCP: Pleas Koch, NP  Referral: Pleas Koch, NP  No chief complaint on file.  Subjective:   Tara Parker is a 56 y.o. very pleasant female patient with There is no height or weight on file to calculate BMI. who presents with the following:  She is here to follow-up after a concussion described below, and she was quite symptomatic when I saw her 2 weeks ago.  At that point, I recommended physical and cognitive rest until she became asymptomatic.  If she felt up to it, short walks would have also been ok, but I asked her to not drive a car.     0/08/5425 Last OV with Owens Loffler, MD  She is a very pleasant 56 year old patient not seen before for various other musculoskeletal complaints who presents today after a closed head trauma obtained on March 22, 2022.  She was at home on went to the bathroom in the middle the night, and then when she came to she was awake on the floor with a headache and nauseousness.   She did hear an audible cracking sound.   She subsequently went to the emergency room on the above date, and was evaluated and felt to have a closed head injury.  She was discharged with routine concussion precautions along with meclizine and Zofran.   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   Concussed -  She continues to be quite concussed.   On symptom evaluation she endorses headache, pressure in head, neck pain, nauseousness, dizziness, balance problems sensitivity to light, sensitivity to noise, feeling slowed down, feeling like she is in a fog, feeling not right, difficulty concentrating, fatigue, drowsiness, more emotional and irritable, some sadness, anxiety and  difficulty sleeping.   She reports feeling roughly 60% normal.   She is having a lot of vertigo and vestibular symptoms.   Total number of symptoms: 19/22 Symptom severity score: 35/132   Orientation: 5/5 Immediate memory 15/15 Digits backwards: 2/4 Months in reverse order 1/1   Saccades and pursuits produce vertigo Unable to perform tandem gait   MS: Tested on [facility] floor Double leg stance 0/10 areas Single-leg stance almost immediate imbalance and stopped Tandem stance immediately stopped without closing eyes  Review of Systems is noted in the HPI, as appropriate  Objective:   LMP 12/03/2014   GEN: No acute distress; alert,appropriate. PULM: Breathing comfortably in no respiratory distress PSYCH: Normally interactive.   Laboratory and Imaging Data:  Assessment and Plan:   ***

## 2022-04-09 ENCOUNTER — Encounter: Payer: Self-pay | Admitting: Family Medicine

## 2022-04-09 ENCOUNTER — Ambulatory Visit (INDEPENDENT_AMBULATORY_CARE_PROVIDER_SITE_OTHER): Payer: BC Managed Care – PPO | Admitting: Family Medicine

## 2022-04-09 VITALS — BP 92/60 | HR 75 | Temp 98.1°F | Ht 65.5 in | Wt 126.2 lb

## 2022-04-09 DIAGNOSIS — Z23 Encounter for immunization: Secondary | ICD-10-CM | POA: Diagnosis not present

## 2022-04-09 DIAGNOSIS — S060X1D Concussion with loss of consciousness of 30 minutes or less, subsequent encounter: Secondary | ICD-10-CM

## 2022-07-07 ENCOUNTER — Ambulatory Visit (INDEPENDENT_AMBULATORY_CARE_PROVIDER_SITE_OTHER): Payer: BC Managed Care – PPO | Admitting: Family Medicine

## 2022-07-07 ENCOUNTER — Encounter: Payer: Self-pay | Admitting: Family Medicine

## 2022-07-07 VITALS — BP 98/60 | HR 71 | Temp 97.6°F | Ht 65.5 in | Wt 133.2 lb

## 2022-07-07 DIAGNOSIS — H8190 Unspecified disorder of vestibular function, unspecified ear: Secondary | ICD-10-CM | POA: Diagnosis not present

## 2022-07-07 DIAGNOSIS — F0781 Postconcussional syndrome: Secondary | ICD-10-CM | POA: Diagnosis not present

## 2022-07-07 DIAGNOSIS — M1611 Unilateral primary osteoarthritis, right hip: Secondary | ICD-10-CM | POA: Diagnosis not present

## 2022-07-07 DIAGNOSIS — Z23 Encounter for immunization: Secondary | ICD-10-CM

## 2022-07-07 NOTE — Progress Notes (Unsigned)
Tara Parker [name], MD, [place] [place], [address]  Phone: [phone]  FAX: [place] - 56 y.o. female  MRN [id number]  Date of Birth: 1966-03-27  Date: 07/07/2022  PCP: Pleas Koch, NP  Referral: Pleas Koch, NP  Chief Complaint  Patient presents with   Follow-up    Concussion    Subjective:   Tara Parker is a 56 y.o. very pleasant female patient with Body mass index is 21.84 kg/m. who presents with the following:  I have not seen the patient in 3 months, she is here to follow-up about some persistent postconcussive symptoms.  Still having some vertigo when lying down and some with incline.  She also has some dizzy sensations with long use of the computer or tablet.  Looking up in cabinet will also induce some vestibular disequilibrium.  Dizziness with prolonged computer work - will get better when she takes a break.   Vertical more than horizontal on VOMS makes nauseous and dizzy  She denies other postconcussive symptoms -including headache, photophobia, phonophobia, nausea, vomiting, diarrhea, heightened depression.    She does have some anxiety, but she also has some anxiety at baseline.  04/09/2022 Last OV with Owens Loffler, MD  She is here to follow-up after a concussion described below, and she was quite symptomatic when I saw her 2 weeks ago.  At that point, I recommended physical and cognitive rest until she became asymptomatic.  If she felt up to it, short walks would have also been ok, but I asked her to not drive a car.    Doing much better   SCAT5, no symptoms: 7/22 Symptom severity: 7/132 - much improved compared to initial evaluation.   She does still have some vertigo when she changes position and lies down, but it resolves after less than 10 seconds.     Symptoms that are rated 1 and a mild scale are dizziness, balance problems,  feeling slowed down, foggy, not quite right, difficulty concentrating, and fatigue.  Aside from this, she is asymptomatic.  No headaches.   Scat 5 exam Orientation 5/5 Immediate memory 15/15 Digits backwards: 3/4 Months in reverse order: 1/1   mBess Double leg stance: 0/10 Single-leg stance: 2/10 Tandem 0/10   03/27/2022 Last OV with Owens Loffler, MD  She is a very pleasant 56 year old patient not seen before for various other musculoskeletal complaints who presents today after a closed head trauma obtained on March 22, 2022.  She was at home on went to the bathroom in the middle the night, and then when she came to she was awake on the floor with a headache and nauseousness.   She did hear an audible cracking sound.   She subsequently went to the emergency room on the above date, and was evaluated and felt to have a closed head injury.  She was discharged with routine concussion precautions along with meclizine and Zofran.   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   Concussed -  She continues to be quite concussed.   On symptom evaluation she endorses headache, pressure in head, neck pain, nauseousness, dizziness, balance problems sensitivity to light, sensitivity to noise, feeling slowed down, feeling like she is in a fog, feeling not right, difficulty concentrating, fatigue, drowsiness, more emotional and irritable, some sadness, anxiety and difficulty sleeping.   She reports feeling roughly 60% normal.   She is having a  lot of vertigo and vestibular symptoms.   Total number of symptoms: 19/22 Symptom severity score: 35/132   Orientation: 5/5 Immediate memory 15/15 Digits backwards: 2/4 Months in reverse order 1/1   Saccades and pursuits produce vertigo Unable to perform tandem gait   MS: Tested on [place] floor Double leg stance 0/10 areas Single-leg stance almost immediate imbalance and stopped Tandem stance immediately stopped without closing  eyes  Review of Systems is noted in the HPI, as appropriate  Objective:   BP 98/60   Pulse 71   Temp 97.6 F (36.4 C) (Oral)   Ht 5' 5.5" (1.664 m)   Wt 133 lb 4 oz (60.4 kg)   LMP 12/03/2014   SpO2 99%   BMI 21.84 kg/m   GEN: No acute distress; alert,appropriate. PULM: Breathing comfortably in no respiratory distress PSYCH: Normally interactive.   Pupils equal round reactive to light and accommodation.  Extraocular movements are intact.  Pursuits and saccades do induce some level of dizziness.  Throat is clear, TMs are clear.  Cranial nerves II through XII are intact.  Sensation is normal.  Romberg is normal.  Finger-nose is normal.  Additional balance testing is normal, mBess.  Full range of motion at the neck.  NPC is normal  Laboratory and Imaging Data:  Assessment and Plan:     ICD-10-CM   1. Post concussive syndrome  F07.81 Ambulatory referral to Physical Therapy    2. Need for influenza vaccination  Z23 Flu Vaccine QUAD 6+ mos PF IM (Fluarix Quad PF)    3. Vestibular dysfunction after traumatic injury  H81.90 Ambulatory referral to Physical Therapy    4. Primary localized osteoarthritis of right hip  M16.11 DG FLUORO GUIDED NEEDLE PLC ASPIRATION/INJECTION LOC     Prolonged postconcussive symptoms greater than 3 months.  At this point, she is essentially only having vestibular dysfunction, and even at her initial concussion she was having predominant vestibular symptoms or some vestibular subtype concussion symptoms.  Initial concussion was in early September, 2023.  I am going have her start vestibular rehab, and I suspect that this will help with her vestibular symptoms with dedicated rehab.  She continues to have arthritic pain on the right hip, and we are going to arrange for a fluoroscopic guided hip injection  Medication Management during today's office visit: No orders of the defined types were placed in this encounter.  There are no discontinued  medications.  Orders placed today for conditions managed today: Orders Placed This Encounter  Procedures   DG FLUORO GUIDED NEEDLE PLC ASPIRATION/INJECTION LOC   Flu Vaccine QUAD 6+ mos PF IM (Fluarix Quad PF)   Ambulatory referral to Physical Therapy    Disposition: Return in about 2 months (around 09/07/2022) for [name] concussion follow-up.  Dr[employer] One speech-to-text software was used for transcription in this dictation.  Possible transcriptional errors can occur using Animal nutritionist.   Signed,  Elpidio Galea. Naven Giambalvo, MD   Outpatient Encounter Medications as of 07/07/2022  Medication Sig   estradiol (ESTRACE) 0.5 MG tablet Take 1 tablet (0.5 mg total) by mouth daily.   Multiple Vitamin (MULTIVITAMIN) capsule Take 1 capsule by mouth daily.   progesterone (PROMETRIUM) 100 MG capsule Take 1 capsule (100 mg total) by mouth daily. Take 14-25 every month   No facility-administered encounter medications on file as of 07/07/2022.

## 2022-08-01 ENCOUNTER — Ambulatory Visit
Admission: RE | Admit: 2022-08-01 | Discharge: 2022-08-01 | Disposition: A | Discharge status: Home / Self Care | Payer: BC Managed Care – PPO | Source: Ambulatory Visit | Attending: Family Medicine | Admitting: Family Medicine

## 2022-08-01 ENCOUNTER — Telehealth: Payer: Self-pay | Admitting: Primary Care

## 2022-08-01 DIAGNOSIS — M1611 Unilateral primary osteoarthritis, right hip: Secondary | ICD-10-CM

## 2022-08-01 MED ORDER — METHYLPREDNISOLONE ACETATE 40 MG/ML INJ SUSP (RADIOLOG
80.0000 mg | Freq: Once | INTRAMUSCULAR | Status: AC
  Administered 2022-08-01: 80 mg via INTRA_ARTICULAR

## 2022-08-01 MED ORDER — IOPAMIDOL (ISOVUE-M 200) INJECTION 41%
1.0000 mL | Freq: Once | INTRAMUSCULAR | Status: AC
  Administered 2022-08-01: 1 mL via INTRA_ARTICULAR

## 2022-08-01 NOTE — Telephone Encounter (Signed)
LM for [facility]) [phone] Following up on Vestibular Rehab referral.   Their office is closed on Friday afternoons and the message stated that they will return calls on the following Monday. I am out of the office on Monday. I left the call back number for this request as [place] office line. Sending to Butch Penny and Dr Lorelei Pont as Juluis Rainier that they should be calling back Monday 08/04/2022

## 2022-08-01 NOTE — Telephone Encounter (Signed)
Tara Parker, can you check on this for me?  This is for vestibular rehab.  The only other place that I know is [facility] in [place] (attached rehab unit to [employer]).  Can you check in and see on the holdup from [facility]?

## 2022-08-01 NOTE — Telephone Encounter (Signed)
Please check on this as below.

## 2022-08-01 NOTE — Telephone Encounter (Signed)
Patient called stating that she is having a hard time speaking to someone to get scheduled at [facility] ,she would like to know if there is another place Dr Tara Parker can refer her to that may be able to call her back and get her scheduled soon,and that's not as busy? Please advise.

## 2022-08-04 NOTE — Telephone Encounter (Signed)
Renee from [facility] called over and stated that she reached out to the patient to get her scheduled and she left her a VM. Thank you!

## 2022-08-20 ENCOUNTER — Ambulatory Visit: Payer: BC Managed Care – PPO | Attending: Family Medicine

## 2022-08-20 DIAGNOSIS — H8190 Unspecified disorder of vestibular function, unspecified ear: Secondary | ICD-10-CM | POA: Insufficient documentation

## 2022-08-20 DIAGNOSIS — R2681 Unsteadiness on feet: Secondary | ICD-10-CM | POA: Diagnosis present

## 2022-08-20 DIAGNOSIS — F0781 Postconcussional syndrome: Secondary | ICD-10-CM | POA: Insufficient documentation

## 2022-08-20 DIAGNOSIS — R42 Dizziness and giddiness: Secondary | ICD-10-CM | POA: Insufficient documentation

## 2022-08-20 NOTE — Therapy (Signed)
OUTPATIENT PHYSICAL THERAPY VESTIBULAR EVALUATION     Patient Name: Tara Parker MRN: [id number] DOB:1965/08/30, 57 y.o., female Today's Date: 08/20/2022  END OF SESSION:  PT End of Session - 08/20/22 1103     Visit Number 1    Number of Visits 25    Date for PT Re-Evaluation 11/12/22    PT Start Time 0846    PT Stop Time 0927    PT Time Calculation (min) 41 min    Activity Tolerance Patient tolerated treatment well    Behavior During Therapy [facility] for tasks assessed/performed             Past Medical History:  Diagnosis Date   Chicken pox    Postmenopausal vaginal bleeding    Shingles    Past Surgical History:  Procedure Laterality Date   BREAST ENHANCEMENT SURGERY     CHOLECYSTECTOMY     Patient Active Problem List   Diagnosis Date Noted   Anxiety 05/08/2020   Vaginal dryness 08/23/2018   Hormone replacement therapy 04/03/2017   Preventative health care 05/04/2015   Knee pain, chronic 12/28/2014    PCP: Pleas Koch, NP  REFERRING PROVIDER:   Owens Loffler, MD    REFERRING DIAG:  F07.81 (ICD-10-CM) - Post concussive syndrome  H81.90 (ICD-10-CM) - Vestibular dysfunction after traumatic injury    THERAPY DIAG:  Dizziness and giddiness - Plan: PT plan of care cert/re-cert  Unsteadiness on feet - Plan: PT plan of care cert/re-cert  ONSET DATE: 12/2692  Rationale for Evaluation and Treatment: Rehabilitation  SUBJECTIVE:   SUBJECTIVE STATEMENT: Pt is a pleasant 57 yo female presenting to PT evaluation for post-concussive syndrome and prolonged vestibular dysfunction from head injury in Sept 2023. Pt accompanied by: self  PERTINENT HISTORY:  Pt is a pleasant 57 yo female presenting to evaluation for post-concussive syndrome and prolonged vestibular dysfunction from head injury. Pt reports in Sept 2023 she was sick, took Nyquil, went to get up at night and passed out and hit her head on the floor. She reports she was not out very long. The  next morning pt woke up with "violent vertigo" with vomiting, was taken to the hospital. Pt reports imaging was normal and she was diagnosed with a concussion. Pt has been following-up with Dr. Lorelei Pont. Pt reports when she lays down she still has vertigo. She can have this even when she has been laying down and "not even moving." Her vertigo can be triggered with rolling, bending and tilting her head back. She reports the spinning lasts for 5-10 seconds. Pt reports she has had anxiety due to her symptoms. Pt is active but vertigo is affecting this as it can be triggered with certain exercises/movements while at the gym.  Other symptoms include a pressure-like feeling in her head. She reports no neck pain or headaches. PMH significant for knee pain (chronic), anxiety, hormone replacement therapy, shingles  PAIN:  Are you having pain? No  PRECAUTIONS: Fall  WEIGHT BEARING RESTRICTIONS: No  FALLS: Has patient fallen in last 6 months? Yes. Number of falls 1  LIVING ENVIRONMENT: Lives with: lives with their spouse  PLOF: Independent  PATIENT GOALS: Get rid of dizziness  OBJECTIVE:   DIAGNOSTIC FINDINGS:   Via chart 03/22/2022 CT HEAD WO CONTRAST: "IMPRESSION: 1. No intracranial abnormality."   COGNITION: Overall cognitive status: Within functional limits for tasks assessed   SENSATION: deferred  POSTURE:  No Significant postural limitations  Cervical ROM:     LOWER EXTREMITY MMT:  BED MOBILITY:  Currently limited due to dizziness  TRANSFERS: Assistive device utilized: None  Sit to stand: Complete Independence Stand to sit: Complete Independence Chair to chair: Complete Independence   GAIT: Gait pattern:  occasional variability in BOS/mild unsteadiness noted when pt turns her head, otherwise pt with WFL gait mechanics Distance walked: clinic distances Assistive device utilized: None    PATIENT SURVEYS:  FOTO 53  VESTIBULAR ASSESSMENT:   SYMPTOM  BEHAVIOR:  Subjective history: Pt describes both positionally triggered vertigo that is brief in nature, and vertigo possibly occurring without movement  Non-Vestibular symptoms:  pt with hx of head injury with concussion dx  Type of dizziness: Spinning/Vertigo   Duration: seconds  Aggravating factors: Spontaneous and Induced by position change: lying supine, rolling to the left, and bending, tilting her head back   OCULOMOTOR EXAM:  Ocular Alignment: normal  Ocular ROM: No Limitations  Spontaneous Nystagmus: absent  Gaze-Induced Nystagmus: absent  Smooth Pursuits: saccades and reports some dizziness with testing  Saccades:  corrective saccade noted to L  Convergence/Divergence: WNL     VESTIBULAR - OCULAR REFLEX:  deferred  Slow VOR:   VOR Cancellation:   Head-Impulse Test:   Dynamic Visual Acuity:    POSITIONAL TESTING:   R Dix-Hallpike: very weak jerk nystagmus noted that is torsoinal and appears upbeating to the R, pt reports dizziness is very mild, lasts <10 seconds L Dix-Hallpike: strong, jerk-nystagmus, torsional to L and upbeating, lasts <30 seconds, pt reports stronger vertigo/dizziness felt   VESTIBULAR TREATMENT:                                                                                                   DATE:   Epley 3x to treat L side - no nystagmus noted with 2nd and 3rd maneuver. Pt reported very brief spinning lasting a second with last maneuver. Pt reported remaining symptoms felt with last maneuver were mostly lightheadedness (resolved in 2 min of rest) and slight queasiness.  PT provided pt with [employer] fact sheet following maneuver (BPPV fact sheet, although PT did explain this is for educational purposes about the maneuver and not a diagnosis)  PT educated pt in post-maneuver precautions: try to sleep on back tonight (can resume normal sleeping position afterward), take it easy for remainder of day regarding activities,  avoid bending/tilting head excessively for remainder of day.    PATIENT EDUCATION: Education details: exam findings, maneuvers, indications, plan Person educated: Patient Education method: Explanation, Demonstration, Verbal cues, and Handouts Education comprehension: verbalized understanding  HOME EXERCISE PROGRAM:  GOALS: Goals reviewed with patient? Yes, initiated on this date    SHORT TERM GOALS: Target date: 10/01/2022   Patient will be independent in home exercise program to improve strength/mobility for better functional independence with ADLs. Baseline: to be initiated as indicated next 1-2 visits Goal status: INITIAL   LONG TERM GOALS: Target date: 11/12/2022    Patient will increase FOTO score to equal to or greater than   61  to demonstrate statistically significant improvement in mobility and quality of life.  Baseline: 53  Goal status: INITIAL  2.  Patient will report return to physical activities/gym activities without onset of vertigo to indicate improved QOL and return to PLOF  Baseline: pt reports vertigo is triggered with positional changes, such as with bench press at gym Goal status: INITIAL  3. Pt will exhibit ability to ambulate at least 10 meters with horizontal and vertical head turns with no greater than 3 gait errors to indicate improved balance Baseline: Pt noted gait error with head turn during gait, to be further assessed next 1-2 visits Goal status: INITIAL   ASSESSMENT:  CLINICAL IMPRESSION: Patient is a pleasant  57 y.o.female who was seen today for physical therapy evaluation and treatment for dizziness with referred dx of post-concussive syndrome and prolonged vestibular dysfunction. On exam pt found to have abnormal oculomotor testing indicative of possible central contribution to ongoing symptoms. However, when Dix-Hallpike performed pt found to have weakly positive R DH, and positive L DH with strong symptoms, nystagmus and vertigo. PT  provided Epley 3x to treat L side today. Instructed pt in post-maneuver precautions and plan to reassess next visit. Pt verbalized understanding for all. The pt will benefit from further skilled PT to improve positionally-triggered dizziness symptoms and balance in order to increase QOL and for pt to safely resume activities.    OBJECTIVE IMPAIRMENTS: decreased balance, dizziness, impaired vision/preception, and improper body mechanics.   ACTIVITY LIMITATIONS: bending, bed mobility, and quick head turns, activities involving tilting her head back  PARTICIPATION LIMITATIONS: cleaning, community activity, and activities requiring bending, tilting her head, turning head quickly  PERSONAL FACTORS: Sex, Time since onset of injury/illness/exacerbation, and 1-2 comorbidities: PMH significant for knee pain (chronic), anxiety, hormone replacement therapy, shingles  are also affecting patient's functional outcome.   REHAB POTENTIAL: Excellent  CLINICAL DECISION MAKING: Evolving/moderate complexity  EVALUATION COMPLEXITY: Moderate   PLAN:  PT FREQUENCY: 2x/week  PT DURATION: 12 weeks  PLANNED INTERVENTIONS: Therapeutic exercises, Therapeutic activity, Neuromuscular re-education, Balance training, Gait training, Patient/Family education, Self Care, Joint mobilization, Stair training, Vestibular training, Canalith repositioning, Visual/preceptual remediation/compensation, Orthotic/Fit training, DME instructions, Dry Needling, Electrical stimulation, Spinal mobilization, Cryotherapy, Moist heat, Splintting, Taping, Traction, Ultrasound, Biofeedback, Manual therapy, and Re-evaluation  PLAN FOR NEXT SESSION: reassessment of positional testing, VOR as indicated, further balance assessment, ROM   Zollie Pee, PT 08/20/2022, 11:31 AM

## 2022-08-28 ENCOUNTER — Ambulatory Visit: Payer: BC Managed Care – PPO

## 2022-09-03 ENCOUNTER — Ambulatory Visit: Payer: BC Managed Care – PPO

## 2022-09-08 ENCOUNTER — Ambulatory Visit: Payer: BC Managed Care – PPO | Admitting: Family Medicine

## 2022-09-10 ENCOUNTER — Ambulatory Visit: Payer: BC Managed Care – PPO

## 2022-09-15 ENCOUNTER — Ambulatory Visit: Payer: BC Managed Care – PPO | Attending: Family Medicine

## 2022-09-15 DIAGNOSIS — R42 Dizziness and giddiness: Secondary | ICD-10-CM | POA: Insufficient documentation

## 2022-09-15 NOTE — Therapy (Signed)
OUTPATIENT PHYSICAL THERAPY VESTIBULAR TREATMENT     Patient Name: Tara Parker MRN: [id number] DOB:July 03, 1966, 57 y.o., female Today's Date: 09/15/2022  END OF SESSION:  PT End of Session - 09/15/22 1507     Visit Number 2    Number of Visits 25    Date for PT Re-Evaluation 11/12/22    PT Start Time 1432    PT Stop Time 1500    PT Time Calculation (min) 28 min    Activity Tolerance Patient tolerated treatment well    Behavior During Therapy [facility] for tasks assessed/performed              Past Medical History:  Diagnosis Date   Chicken pox    Postmenopausal vaginal bleeding    Shingles    Past Surgical History:  Procedure Laterality Date   BREAST ENHANCEMENT SURGERY     CHOLECYSTECTOMY     Patient Active Problem List   Diagnosis Date Noted   Anxiety 05/08/2020   Vaginal dryness 08/23/2018   Hormone replacement therapy 04/03/2017   Preventative health care 05/04/2015   Knee pain, chronic 12/28/2014    PCP: Pleas Koch, NP  REFERRING PROVIDER:   Owens Loffler, MD    REFERRING DIAG:  F07.81 (ICD-10-CM) - Post concussive syndrome  H81.90 (ICD-10-CM) - Vestibular dysfunction after traumatic injury    THERAPY DIAG:  Dizziness and giddiness  ONSET DATE: 03/2022  Rationale for Evaluation and Treatment: Rehabilitation  SUBJECTIVE:   SUBJECTIVE STATEMENT: Pt reports no spinning, but has had pressure sensation she would experience prior to spinning still. She describes continued dizziness and queasiness. Does describe one moment last week of a very brief spin at her computer, not a full spin. Still feeling dizzy when moving too quick or looking up in the cabinet.   Pt accompanied by: self  PERTINENT HISTORY:  Pt is a pleasant 58 yo female presenting to evaluation for post-concussive syndrome and prolonged vestibular dysfunction from head injury. Pt reports in Sept 2023 she was sick, took Nyquil, went to get up at night and passed out and hit her  head on the floor. She reports she was not out very long. The next morning pt woke up with "violent vertigo" with vomiting, was taken to the hospital. Pt reports imaging was normal and she was diagnosed with a concussion. Pt has been following-up with Dr. Lorelei Pont. Pt reports when she lays down she still has vertigo. She can have this even when she has been laying down and "not even moving." Her vertigo can be triggered with rolling, bending and tilting her head back. She reports the spinning lasts for 5-10 seconds. Pt reports she has had anxiety due to her symptoms. Pt is active but vertigo is affecting this as it can be triggered with certain exercises/movements while at the gym.  Other symptoms include a pressure-like feeling in her head. She reports no neck pain or headaches. PMH significant for knee pain (chronic), anxiety, hormone replacement therapy, shingles  PAIN:  Are you having pain? No  PRECAUTIONS: Fall  WEIGHT BEARING RESTRICTIONS: No  FALLS: Has patient fallen in last 6 months? Yes. Number of falls 1  LIVING ENVIRONMENT: Lives with: lives with their spouse  PLOF: Independent  PATIENT GOALS: Get rid of dizziness  OBJECTIVE:   DIAGNOSTIC FINDINGS:   Via chart 03/22/2022 CT HEAD WO CONTRAST: "IMPRESSION: 1. No intracranial abnormality."   COGNITION: Overall cognitive status: Within functional limits for tasks assessed   SENSATION: deferred  POSTURE:  No  Significant postural limitations  Cervical ROM:     LOWER EXTREMITY MMT:    BED MOBILITY:  Currently limited due to dizziness  TRANSFERS: Assistive device utilized: None  Sit to stand: Complete Independence Stand to sit: Complete Independence Chair to chair: Complete Independence   GAIT: Gait pattern:  occasional variability in BOS/mild unsteadiness noted when pt turns her head, otherwise pt with WFL gait mechanics Distance walked: clinic distances Assistive device utilized: None    PATIENT SURVEYS:   FOTO 53  VESTIBULAR ASSESSMENT:   SYMPTOM BEHAVIOR:  Subjective history: Pt describes both positionally triggered vertigo that is brief in nature, and vertigo possibly occurring without movement  Non-Vestibular symptoms:  pt with hx of head injury with concussion dx  Type of dizziness: Spinning/Vertigo   Duration: seconds  Aggravating factors: Spontaneous and Induced by position change: lying supine, rolling to the left, and bending, tilting her head back   OCULOMOTOR EXAM:  Ocular Alignment: normal  Ocular ROM: No Limitations  Spontaneous Nystagmus: absent  Gaze-Induced Nystagmus: absent  Smooth Pursuits: saccades and reports some dizziness with testing  Saccades:  corrective saccade noted to L  Convergence/Divergence: WNL     VESTIBULAR - OCULAR REFLEX:  deferred  Slow VOR:   VOR Cancellation:   Head-Impulse Test:   Dynamic Visual Acuity:    POSITIONAL TESTING:   R Dix-Hallpike: very weak jerk nystagmus noted that is torsoinal and appears upbeating to the R, pt reports dizziness is very mild, lasts <10 seconds L Dix-Hallpike: strong, jerk-nystagmus, torsional to L and upbeating, lasts <30 seconds, pt reports stronger vertigo/dizziness felt   VESTIBULAR TREATMENT:                                                                                                   DATE:    NMR: L DH: L upbeat and torsional  nystagmus <10 seconds, pt reports spinning that also lasts 10-15 seconds.  L Epley 3x. No nystagmus by second maneuver, but noted lightheadedness, very brief, felt in initial position and with sitting up. R DH: brief pressure feeling, but no nystagmus or spinning reported  PT instructed pt to follow post-maneuver precautions following session.  Instructed pt in seated VORx1: vertical head turns 1x30 sec (no symptoms), horizontal head turns 2x30 sec (makes pt feel slightly lightheaded).  Instructed pt to initiate VORx1 HEP (seated, horizontal head turns) in two days  if pt still symptomatic. Reviewed 0-10 dizziness/lightheadedness scale and to perform only to 1-2/10 then rest until symptoms come back down. Plan to follow up in 1-2 weeks for reassessment.     PATIENT EDUCATION: Education details: Dix-Hallpike reassessment, findings, post-maneuver pecautoins and seated VORx1 HEP Person educated: Patient Education method: Explanation, Demonstration, Verbal cues, and Handouts Education comprehension: verbalized understanding  HOME EXERCISE PROGRAM:  09/15/22: Access Code: [email or web address] URL: [email or web address] Date: 09/15/2022 Prepared by: Ricard Dillon  Exercises - Seated Gaze Stabilization with Head Rotation  - 1 x daily - 7 x weekly - 3 sets - 2 reps - 30 seconds hold  GOALS: Goals reviewed with patient? Yes, initiated on this date  SHORT TERM GOALS: Target date: 10/27/2022   Patient will be independent in home exercise program to improve strength/mobility for better functional independence with ADLs. Baseline: to be initiated as indicated next 1-2 visits Goal status: INITIAL   LONG TERM GOALS: Target date: 12/08/2022    Patient will increase FOTO score to equal to or greater than   61  to demonstrate statistically significant improvement in mobility and quality of life.  Baseline: 53 Goal status: INITIAL  2.  Patient will report return to physical activities/gym activities without onset of vertigo to indicate improved QOL and return to PLOF  Baseline: pt reports vertigo is triggered with positional changes, such as with bench press at gym Goal status: INITIAL  3. Pt will exhibit ability to ambulate at least 10 meters with horizontal and vertical head turns with no greater than 3 gait errors to indicate improved balance Baseline: Pt noted gait error with head turn during gait, to be further assessed next 1-2 visits Goal status: INITIAL   ASSESSMENT:  CLINICAL IMPRESSION: Dix-Hallpike reassessed today as pt still having  some symptoms although symptoms have improved significantly following last appointment. Pt still with positive L Dix-Hallpike with nystagmus lasting under 10 second. PT provided Epley to treat L posterior canal 3x. Pt without nystagmus or spinning by second maneuver, but did report brief pressure feeling that she gets prior to spinning. Plan to reassess Dix-Hallpike again next visit. May initiate training in self-Epley for home next visit if symptoms continue. PT also initiated VORx1 (seated, horizontal head turns) to be started in two days should pt continue to experience symptoms with movement.  Pt verbalized understanding for all. The pt will benefit from further skilled PT to improve positionally-triggered dizziness symptoms and balance in order to increase QOL and for pt to safely resume activities.    OBJECTIVE IMPAIRMENTS: decreased balance, dizziness, impaired vision/preception, and improper body mechanics.   ACTIVITY LIMITATIONS: bending, bed mobility, and quick head turns, activities involving tilting her head back  PARTICIPATION LIMITATIONS: cleaning, community activity, and activities requiring bending, tilting her head, turning head quickly  PERSONAL FACTORS: Sex, Time since onset of injury/illness/exacerbation, and 1-2 comorbidities: PMH significant for knee pain (chronic), anxiety, hormone replacement therapy, shingles  are also affecting patient's functional outcome.   REHAB POTENTIAL: Excellent  CLINICAL DECISION MAKING: Evolving/moderate complexity  EVALUATION COMPLEXITY: Moderate   PLAN:  PT FREQUENCY: 2x/week  PT DURATION: 12 weeks  PLANNED INTERVENTIONS: Therapeutic exercises, Therapeutic activity, Neuromuscular re-education, Balance training, Gait training, Patient/Family education, Self Care, Joint mobilization, Stair training, Vestibular training, Canalith repositioning, Visual/preceptual remediation/compensation, Orthotic/Fit training, DME instructions, Dry Needling,  Electrical stimulation, Spinal mobilization, Cryotherapy, Moist heat, Splintting, Taping, Traction, Ultrasound, Biofeedback, Manual therapy, and Re-evaluation  PLAN FOR NEXT SESSION: reassessment of positional testing, VOR as indicated, Aline Brochure, PT 09/15/2022, 3:08 PM

## 2022-09-17 ENCOUNTER — Ambulatory Visit: Payer: BC Managed Care – PPO

## 2022-09-25 ENCOUNTER — Ambulatory Visit: Payer: BC Managed Care – PPO

## 2022-10-01 ENCOUNTER — Ambulatory Visit: Payer: BC Managed Care – PPO

## 2022-10-09 ENCOUNTER — Ambulatory Visit: Payer: BC Managed Care – PPO

## 2022-10-14 ENCOUNTER — Ambulatory Visit: Payer: BC Managed Care – PPO | Attending: Family Medicine

## 2022-10-16 ENCOUNTER — Ambulatory Visit: Payer: BC Managed Care – PPO

## 2022-10-23 ENCOUNTER — Ambulatory Visit: Payer: BC Managed Care – PPO

## 2022-10-30 ENCOUNTER — Ambulatory Visit: Payer: BC Managed Care – PPO

## 2022-11-06 ENCOUNTER — Ambulatory Visit: Payer: BC Managed Care – PPO

## 2022-11-07 LAB — HM MAMMOGRAPHY

## 2022-11-13 ENCOUNTER — Ambulatory Visit: Payer: BC Managed Care – PPO

## 2022-11-20 ENCOUNTER — Ambulatory Visit: Payer: BC Managed Care – PPO

## 2022-11-23 ENCOUNTER — Ambulatory Visit
Admission: EM | Admit: 2022-11-23 | Discharge: 2022-11-23 | Disposition: A | Discharge status: Home / Self Care | Payer: BC Managed Care – PPO | Attending: Nurse Practitioner | Admitting: Nurse Practitioner

## 2022-11-23 ENCOUNTER — Other Ambulatory Visit: Payer: Self-pay

## 2022-11-23 ENCOUNTER — Ambulatory Visit (INDEPENDENT_AMBULATORY_CARE_PROVIDER_SITE_OTHER): Payer: BC Managed Care – PPO

## 2022-11-23 ENCOUNTER — Encounter (HOSPITAL_BASED_OUTPATIENT_CLINIC_OR_DEPARTMENT_OTHER): Payer: Self-pay | Admitting: Emergency Medicine

## 2022-11-23 ENCOUNTER — Emergency Department (HOSPITAL_BASED_OUTPATIENT_CLINIC_OR_DEPARTMENT_OTHER)
Admission: EM | Admit: 2022-11-23 | Discharge: 2022-11-23 | Disposition: A | Discharge status: Home / Self Care | Payer: BC Managed Care – PPO | Attending: Emergency Medicine | Admitting: Emergency Medicine

## 2022-11-23 DIAGNOSIS — J029 Acute pharyngitis, unspecified: Secondary | ICD-10-CM

## 2022-11-23 DIAGNOSIS — L509 Urticaria, unspecified: Secondary | ICD-10-CM | POA: Diagnosis not present

## 2022-11-23 DIAGNOSIS — K219 Gastro-esophageal reflux disease without esophagitis: Secondary | ICD-10-CM | POA: Diagnosis not present

## 2022-11-23 DIAGNOSIS — R079 Chest pain, unspecified: Secondary | ICD-10-CM | POA: Diagnosis present

## 2022-11-23 DIAGNOSIS — T7840XA Allergy, unspecified, initial encounter: Secondary | ICD-10-CM | POA: Diagnosis not present

## 2022-11-23 DIAGNOSIS — R21 Rash and other nonspecific skin eruption: Secondary | ICD-10-CM

## 2022-11-23 LAB — CBC
HCT: 46.3 % — ABNORMAL HIGH (ref 36.0–46.0)
Hemoglobin: 16.4 g/dL — ABNORMAL HIGH (ref 12.0–15.0)
MCH: 33.1 pg (ref 26.0–34.0)
MCHC: 35.4 g/dL (ref 30.0–36.0)
MCV: 93.3 fL (ref 80.0–100.0)
Platelets: 170 10*3/uL (ref 150–400)
RBC: 4.96 MIL/uL (ref 3.87–5.11)
RDW: 11.2 % — ABNORMAL LOW (ref 11.5–15.5)
WBC: 9.3 10*3/uL (ref 4.0–10.5)
nRBC: 0 % (ref 0.0–0.2)

## 2022-11-23 LAB — BASIC METABOLIC PANEL
Anion gap: 5 (ref 5–15)
BUN: 19 mg/dL (ref 6–20)
CO2: 32 mmol/L (ref 22–32)
Calcium: 8.9 mg/dL (ref 8.9–10.3)
Chloride: 104 mmol/L (ref 98–111)
Creatinine, Ser: 1.06 mg/dL — ABNORMAL HIGH (ref 0.44–1.00)
GFR, Estimated: >60 mL/min (ref >60)
Glucose, Bld: 107 mg/dL — ABNORMAL HIGH (ref 70–99)
Potassium: 4 mmol/L (ref 3.5–5.1)
Sodium: 141 mmol/L (ref 135–145)

## 2022-11-23 LAB — POCT RAPID STREP A (OFFICE): Rapid Strep A Screen: NEGATIVE

## 2022-11-23 LAB — TROPONIN I (HIGH SENSITIVITY)
Troponin I (High Sensitivity): <2 ng/L (ref <18)
Troponin I (High Sensitivity): <2 ng/L (ref <18)

## 2022-11-23 MED ORDER — METHYLPREDNISOLONE SODIUM SUCC 125 MG IJ SOLR
125.0000 mg | Freq: Once | INTRAMUSCULAR | Status: AC
  Administered 2022-11-23: 125 mg via INTRAVENOUS
  Filled 2022-11-23: qty 2

## 2022-11-23 MED ORDER — PREDNISONE 10 MG PO TABS: 20.0000 mg | ORAL_TABLET | Freq: Every day | ORAL | 0 refills | Status: DC

## 2022-11-23 MED ORDER — PANTOPRAZOLE SODIUM 20 MG PO TBEC: 20.0000 mg | DELAYED_RELEASE_TABLET | Freq: Every day | ORAL | 0 refills | Status: DC

## 2022-11-23 MED ORDER — SODIUM CHLORIDE 0.9 % IV BOLUS
1000.0000 mL | Freq: Once | INTRAVENOUS | Status: AC
  Administered 2022-11-23: 1000 mL via INTRAVENOUS

## 2022-11-23 MED ORDER — DIPHENHYDRAMINE HCL 50 MG/ML IJ SOLN
12.5000 mg | Freq: Once | INTRAMUSCULAR | Status: AC
  Administered 2022-11-23: 12.5 mg via INTRAVENOUS
  Filled 2022-11-23: qty 1

## 2022-11-23 MED ORDER — FAMOTIDINE IN NACL 20-0.9 MG/50ML-% IV SOLN
20.0000 mg | Freq: Once | INTRAVENOUS | Status: AC
  Administered 2022-11-23: 20 mg via INTRAVENOUS
  Filled 2022-11-23: qty 50

## 2022-11-23 NOTE — ED Triage Notes (Addendum)
Patient to Urgent Care with husband- complaints of rash and a throat problem.  Reports on Friday also had some "bumps" on her tongue that have now resolved. Reports she kept having a sensation in her throat that felt like "air bubbles" that were difficult to swallow. The sensation would radiate down into her chest. It would go away and then come back. Reports now her throat is painful to swallow. Also having some loose stools/ stomach cramps. Reports while she was in her car waiting to be seen she developed centralized, chest discomfort that radiates into her back.   Reports rash is itchy, has increased in size and spread to various places on her body. Denies any new products/ foods.

## 2022-11-23 NOTE — ED Provider Notes (Signed)
Tara Parker    CSN: [id number] Arrival date & time: 11/23/22  1033      History   Chief Complaint Chief Complaint  Patient presents with   Rash   Dysphagia    HPI Tara Parker is a 57 y.o. female presents with multiple complaints.  Patient reports Friday she developed what she describes as "air bubbles" in her throat that made it difficult to swallow followed by chest pain that is a central radiating chest pain that she describes as pressure.  This seemed to resolve and she went home and went to bed that night.  The next day she woke with similar symptoms but also developed a sore throat and then noticed a pruritic red rash that is since spread from her waistband to her torso arms and legs.  She states she continues to have some chest pain with swallowing but this chest pain does not radiate.  She denies any cough, fevers, congestion, shortness of breath, dizziness, syncope.  She did have some nausea this morning that resolved.  Denies any history of hypertension, hyperlipidemia, diabetes.  Denies history of enlarged thyroid.  Denies any cardiac history.  Reports mom had a MI in her 67s.  Patient denies smoking history.  Denies any history of eczema or psoriasis.  Denies any new contacts including soaps, medications, lotions, etc.  She states Benadryl and taking a warm shower tends to help the rash.  Denies any tongue, lip, throat swelling or difficulty breathing.  She is able to eat and drink normally.  No other concerns at this time.   Rash Associated symptoms: sore throat     Past Medical History:  Diagnosis Date   Chicken pox    Postmenopausal vaginal bleeding    Shingles     Patient Active Problem List   Diagnosis Date Noted   Anxiety 05/08/2020   Vaginal dryness 08/23/2018   Hormone replacement therapy 04/03/2017   Preventative health care 05/04/2015   Knee pain, chronic 12/28/2014    Past Surgical History:  Procedure Laterality Date   BREAST ENHANCEMENT  SURGERY     CHOLECYSTECTOMY      OB History     Gravida  1   Para  1   Term      Preterm      AB      Living  1      SAB      IAB      Ectopic      Multiple      Live Births  1            Home Medications    Prior to Admission medications   Medication Sig Start Date End Date Taking? Authorizing Provider  estradiol (ESTRACE) 0.5 MG tablet Take 1 tablet (0.5 mg total) by mouth daily. 11/27/21   Reva Bores, MD  Multiple Vitamin (MULTIVITAMIN) capsule Take 1 capsule by mouth daily.    [provider]  progesterone (PROMETRIUM) 100 MG capsule Take 1 capsule (100 mg total) by mouth daily. Take 14-25 every month 11/27/21   Reva Bores, MD    Family History Family History  Problem Relation Age of Onset   Heart disease Mother    Heart attack Mother    Cancer Maternal Grandmother 50       Uterine    Social History Social History   Tobacco Use   Smoking status: Former    Types: Cigarettes   Smokeless tobacco: Never  Tobacco comments:    Teen years   Vaping Use   Vaping Use: Never used  Substance Use Topics   Alcohol use: Yes    Comment: rare   Drug use: No     Allergies   Penicillins   Review of Systems Review of Systems  HENT:  Positive for sore throat.   Cardiovascular:  Positive for chest pain.  Skin:  Positive for rash.     Physical Exam Triage Vital Signs ED Triage Vitals  Enc Vitals Group     BP 11/23/22 1136 111/73     Pulse Rate 11/23/22 1136 90     Resp 11/23/22 1136 18     Temp 11/23/22 1136 97.9 F (36.6 C)     Temp src --      SpO2 11/23/22 1136 98 %     Weight --      Height --      Head Circumference --      Peak Flow --      Pain Score 11/23/22 1135 2     Pain Loc --      Pain Edu? --      Excl. in GC? --    No data found.  Updated Vital Signs BP 111/73   Pulse 90   Temp 97.9 F (36.6 C)   Resp 18   LMP 12/03/2014   SpO2 98%   Visual Acuity Right Eye Distance:   Left Eye Distance:    Bilateral Distance:    Right Eye Near:   Left Eye Near:    Bilateral Near:     Physical Exam Vitals and nursing note reviewed.  Constitutional:      General: She is not in acute distress.    Appearance: Normal appearance. She is not ill-appearing, toxic-appearing or diaphoretic.  HENT:     Head: Normocephalic and atraumatic.     Nose: Nose normal.     Mouth/Throat:     Mouth: Mucous membranes are moist.     Pharynx: Posterior oropharyngeal erythema present. No oropharyngeal exudate.  Eyes:     Extraocular Movements: Extraocular movements intact.     Conjunctiva/sclera: Conjunctivae normal.     Pupils: Pupils are equal, round, and reactive to light.  Cardiovascular:     Rate and Rhythm: Normal rate and regular rhythm.     Heart sounds: Normal heart sounds.  Pulmonary:     Effort: Pulmonary effort is normal.     Breath sounds: Normal breath sounds.     Comments: Chest pain not reproducible with palpation Chest:     Chest wall: No tenderness.  Abdominal:     General: Bowel sounds are normal.     Palpations: Abdomen is soft.     Tenderness: There is no abdominal tenderness.  Skin:    General: Skin is warm and dry.     Comments: Scattered macular papular mildly erythematous rash on torso, arms and legs.  No vesicles lesions, swelling, drainage.  Neurological:     General: No focal deficit present.     Mental Status: She is alert and oriented to person, place, and time.  Psychiatric:        Mood and Affect: Mood normal.        Behavior: Behavior normal.      UC Treatments / Results  Labs (all labs ordered are listed, but only abnormal results are displayed) Labs Reviewed  POCT RAPID STREP A (OFFICE)   Comprehensive metabolic panel Order: [id number] Status: Final  result     Visible to patient: Yes (seen)     Next appt: None   0 Result Notes       Component Ref Range & Units 8 mo ago 1 yr ago 5 yr ago 7 yr ago  Sodium 135 - 145 mmol/L 140 137 R 139 R 142 R   Potassium 3.5 - 5.1 mmol/L 3.6 4.1 R 4.0 R 4.2 R  Chloride 98 - 111 mmol/L 108 101 R 102 R 103 R  CO2 22 - 32 mmol/L 26 29 R 32 R 30 R  Glucose, Bld 70 - 99 mg/dL 657 High  84 94 88  Comment: Glucose reference range applies only to samples taken after fasting for at least 8 hours.  BUN 6 - 20 mg/dL 17 26 High  R 23 R 16 R  Creatinine, Ser 0.44 - 1.00 mg/dL 8.46 9.62 R 9.52 R 8.41 R  Calcium 8.9 - 10.3 mg/dL 8.7 Low  9.2 R 9.8 R 9.8 R  Total Protein 6.5 - 8.1 g/dL 6.9 6.7 R 7.1 R 7.1 R  Albumin 3.5 - 5.0 g/dL 4.2 4.5 R 4.8 R 4.5 R  AST 15 - 41 U/L 35 37 R 31 R 26 R  ALT 0 - 44 U/L 33 53 High  R 25 R 20 R  Alkaline Phosphatase 38 - 126 U/L 29 Low  29 Low  R 27 Low  R 29 Low  R  Total Bilirubin 0.3 - 1.2 mg/dL 1.0 0.8 R 1.1 R 0.9 R  GFR, Estimated >60 mL/min >60     Comment: (NOTE) Calculated using the CKD-EPI Creatinine Equation (2021)  Anion gap 5 - 15 6     Comment: Performed at [facility], [address], [place], [place] [address]  Resulting Agency [facility] CLIN LAB [place] [facility] [place] [facility] [place] [facility]     EKG   Radiology DG Chest 2 View  Result Date: 11/23/2022 CLINICAL DATA:  Chest pain. EXAM: CHEST - 2 VIEW COMPARISON:  None Available. FINDINGS: The heart size and mediastinal contours are within normal limits. Both lungs are clear. The visualized skeletal structures are unremarkable. IMPRESSION: No active cardiopulmonary disease. Electronically Signed   By: Danae Orleans M.D.   On: 11/23/2022 12:19    Procedures ED EKG  Date/Time: 11/23/2022 12:39 PM  Performed by: Radford Pax, NP Authorized by: Radford Pax, NP   ECG interpreted by ED Physician in the absence of a cardiologist: no   Previous ECG:    Previous ECG:  Compared to current Rate:    ECG rate assessment: normal   Rhythm:    Rhythm: sinus rhythm   Ectopy:    Ectopy: none   QRS:    QRS conduction: normal   ST segments:    ST segments:  Normal T waves:    T waves: normal    Comments:     Sinus rhythm with possible anterior infarct but no acute ST-T wave changes  (including critical care time)  Medications Ordered in UC Medications - No data to display  Initial Impression / Assessment and Plan / UC Course  I have reviewed the triage vital signs and the nursing notes.  Pertinent labs & imaging results that were available during my care of the patient were reviewed by me and considered in my medical decision making (see chart for details).     Reviewed exam, testing, and symptoms with patient and her husband.  Discussed limitations and abilities of  urgent care.  Patient with very nonspecific complaints including chest pain, difficulty swallowing, and diffuse rash.  Chest x-ray and strep test were negative in clinic.  EKG shows sinus rhythm but cannot rule out anterior infarct.  Given her symptoms I did advise she go to the emergency room for further evaluation and treatment.  Patient is in agreement with plan and will go POV with her husband to the ER.  She was instructed to pull over and call 911 for any worsening symptoms that occur in transit and she verbalized understanding Final Clinical Impressions(s) / UC Diagnoses   Final diagnoses:  Sore throat  Chest pain, unspecified type  Rash and nonspecific skin eruption     Discharge Instructions      Please go to the emergency room for further evaluation of your symptoms     ED Prescriptions   None    PDMP not reviewed this encounter.   Radford Pax, NP 11/23/22 [phone]

## 2022-11-23 NOTE — ED Notes (Signed)
Patient is being discharged from the Urgent Care and sent to the Emergency Department via POV . Per Cheri Rous NP, patient is in need of higher level of care due to chest pain. Patient is aware and verbalizes understanding of plan of care.  Vitals:   11/23/22 1136  BP: 111/73  Pulse: 90  Resp: 18  Temp: 97.9 F (36.6 C)  SpO2: 98%

## 2022-11-23 NOTE — ED Triage Notes (Signed)
Pt arrives pov, steady gait with c/o central CP x 3 days pta radiating to back while driving. Also reports tongue swelling with bumps later that same day. Referred by UC. Pt denies difficulty swallowing at this time. Speech clear. Also reports itchy rash, 50mg  benadryl at 10

## 2022-11-23 NOTE — ED Provider Notes (Signed)
[place] EMERGENCY DEPARTMENT AT [facility] Provider Note   CSN: [id number] Arrival date & time: 11/23/22  1342     History  Chief Complaint  Patient presents with   Chest Pain    Tara Parker is a 57 y.o. female no significant past medical history presented with 3 days of chest pain.  Patient states the chest pain is substernal and radiates to her back and accompanied with a pruritic rash.  Patient started noticing this rash while she was driving and states the rashes spread to her upper extremities and torso.  Patient has tried Benadryl this morning which seemed to alleviate symptoms slightly.  Patient states that the chest pain comes and goes but notes that it is worse when she is lying down.  Patient states the chest pain has not radiated to her back and has days substernal and will last for moments at a time.  Patient denies any new foods, travel, soaps, lotions, clothes and is unsure what caused her allergic reaction.  Patient notes that she is still able to tolerate food and fluids orally but believes initially her throat was closing up 3 days ago.  Patient denies shortness of breath, vision changes, headache, fevers, chills, dysuria, abdominal pain, nausea/vomiting, new meds  Home Medications Prior to Admission medications   Medication Sig Start Date End Date Taking? Authorizing Provider  pantoprazole (PROTONIX) 20 MG tablet Take 1 tablet (20 mg total) by mouth daily for 10 days. 11/23/22 12/03/22 Yes Tracey Hermance, Beverly Gust, PA-C  predniSONE (DELTASONE) 10 MG tablet Take 2 tablets (20 mg total) by mouth daily. 11/23/22  Yes Brittnie Lewey, Beverly Gust, PA-C  estradiol (ESTRACE) 0.5 MG tablet Take 1 tablet (0.5 mg total) by mouth daily. 11/27/21   Reva Bores, MD  Multiple Vitamin (MULTIVITAMIN) capsule Take 1 capsule by mouth daily.    [provider]  progesterone (PROMETRIUM) 100 MG capsule Take 1 capsule (100 mg total) by mouth daily. Take 14-25 every month 11/27/21   Reva Bores, MD      Allergies    Penicillins    Review of Systems   Review of Systems See HPI Physical Exam Updated Vital Signs BP 107/65   Pulse 83   Temp 98.5 F (36.9 C) (Oral)   Resp 18   Ht 5' 5.5" (1.664 m)   Wt 59 kg   LMP 12/03/2014   SpO2 95%   BMI 21.30 kg/m  Physical Exam Vitals reviewed.  Constitutional:      General: She is not in acute distress. HENT:     Head: Normocephalic and atraumatic.     Comments: No facial swelling    Nose: Nose normal.     Mouth/Throat:     Mouth: Mucous membranes are moist.     Pharynx: Posterior oropharyngeal erythema present.     Comments: No edema noted Tolerating secretions Eyes:     Extraocular Movements: Extraocular movements intact.     Conjunctiva/sclera: Conjunctivae normal.     Pupils: Pupils are equal, round, and reactive to light.  Neck:     Comments: No neck swelling Cardiovascular:     Rate and Rhythm: Normal rate and regular rhythm.     Pulses: Normal pulses.     Heart sounds: Normal heart sounds.     Comments: 2+ bilateral radial/dorsalis pedis pulses with regular rate Pulmonary:     Effort: Pulmonary effort is normal. No respiratory distress.     Breath sounds: Normal breath sounds. No wheezing.  Abdominal:     Palpations: Abdomen is soft.     Tenderness: There is no abdominal tenderness. There is no guarding or rebound.  Musculoskeletal:        General: Normal range of motion.     Cervical back: Normal range of motion and neck supple.     Comments: 5 out of 5 bilateral grip/leg extension strength  Skin:    General: Skin is warm and dry.     Capillary Refill: Capillary refill takes less than 2 seconds.     Findings: Rash (Urticarial rash noted across torso, pruritic in nature and nontender to palpation) present.  Neurological:     General: No focal deficit present.     Mental Status: She is alert and oriented to person, place, and time.     Comments: Sensation intact in all 4 limbs  Psychiatric:         Mood and Affect: Mood normal.     ED Results / Procedures / Treatments   Labs (all labs ordered are listed, but only abnormal results are displayed) Labs Reviewed  BASIC METABOLIC PANEL - Abnormal; Notable for the following components:      Result Value   Glucose, Bld 107 (*)    Creatinine, Ser 1.06 (*)    All other components within normal limits  CBC - Abnormal; Notable for the following components:   Hemoglobin 16.4 (*)    HCT 46.3 (*)    RDW 11.2 (*)    All other components within normal limits  TROPONIN I (HIGH SENSITIVITY)  TROPONIN I (HIGH SENSITIVITY)    EKG EKG Interpretation  Date/Time:  Sunday Nov 23 2022 13:55:39 EDT Ventricular Rate:  90 PR Interval:  120 QRS Duration: 74 QT Interval:  332 QTC Calculation: 406 R Axis:   77 Text Interpretation: Normal sinus rhythm Normal ECG When compared with ECG of 23-Nov-2022 12:11, No significant change was found Confirmed by Vonita Moss [phone] on 11/23/2022 3:57:22 PM  Radiology DG Chest 2 View  Result Date: 11/23/2022 CLINICAL DATA:  Chest pain. EXAM: CHEST - 2 VIEW COMPARISON:  None Available. FINDINGS: The heart size and mediastinal contours are within normal limits. Both lungs are clear. The visualized skeletal structures are unremarkable. IMPRESSION: No active cardiopulmonary disease. Electronically Signed   By: Danae Orleans M.D.   On: 11/23/2022 12:19    Procedures Procedures    Medications Ordered in ED Medications  famotidine (PEPCID) IVPB 20 mg premix (0 mg Intravenous Stopped 11/23/22 1633)  methylPREDNISolone sodium succinate (SOLU-MEDROL) 125 mg/2 mL injection 125 mg (125 mg Intravenous Given 11/23/22 1505)  sodium chloride 0.9 % bolus 1,000 mL (0 mLs Intravenous Stopped 11/23/22 1633)  diphenhydrAMINE (BENADRYL) injection 12.5 mg (12.5 mg Intravenous Given 11/23/22 1503)    ED Course/ Medical Decision Making/ A&P                             Medical Decision Making  Tara Parker 57 y.o. presented  today for chest pain. Working DDx that I considered at this time includes, but not limited to, ACS, GERD, pulmonary embolism, community-acquired pneumonia, aortic dissection, pneumothorax, underlying bony abnormality, anemia, thyrotoxicosis, esophageal rupture.    R/o Dx: ACS, pulmonary embolism, community-acquired pneumonia, aortic dissection, pneumothorax, underlying bony abnormality, anemia, thyrotoxicosis, esophageal rupture: These are considered less likely due to history of present illness and physical exam findings. Aortic Dissection: less likely based on the location, quality, onset, and severity of symptoms  in this case. Smoking. Patient also has a lack of underlying history of AD or TAA.   Review of prior external notes: 11/23/2022 urgent care  Unique Tests and My Interpretation:  EKG: Rate, rhythm, axis, intervals all examined and without medically relevant abnormality. ST segments without concerns for elevations Troponin: Less than 2 CXR: No acute cardiopulmonary changes, this was obtained through the urgent care earlier today CBC: Unremarkable BMP: Unremarkable  Discussion with Independent Historian:  Husband  Discussion of Management of Tests: None  Risk: Medium: prescription drug management  Risk Stratification Score: None  Plan: Patient presented for chest pain and rash. On exam patient was in no acute distress and stable vitals. Patient's physical was markable for urticarial rash and erythema in the oropharynx without edema. Labs will be ordered.  Patient had chest x-ray done earlier today that was normal and so chest x-ray would not be ordered during this encounter.  Patient's history is suspicious for allergic reaction.  Patient be given Benadryl, Solu-Medrol, Pepcid and fluids for her symptoms and monitored.  Patient states she last had chest pain this morning at 9 AM and so we will only need 1 troponin if the first is negative as we are past 6 hours.  Patient stable at  this time.  On recheck patient stated that she felt much better after receiving steroids, Benadryl, famotidine.  Patient CBC is reassuring currently awaiting other labs but highly suspect the acid reflux is secondary to an allergic reaction as opposed to cardiac etiology causing patient's chest pain.  Anticipates discharge with Protonix, prednisone with primary care follow-up.  Patient's labs are reassuring.  As stated above only 1 troponin will be used as the first 1 is essentially 0.  Patient is feels much better after receiving treatment here today and will be prescribed Protonix and steroids and educated that she may use the Claritin/Zyrtec or Benadryl as needed for her rash.  Encourage patient follow-up with primary care provider her recent symptoms and ER visit and to return if she feels that her throat is closing up her symptoms worsen.  Patient was given return precautions. Patient stable for discharge at this time.  Patient verbalized understanding of plan.         Final Clinical Impression(s) / ED Diagnoses Final diagnoses:  Gastroesophageal reflux disease without esophagitis  Allergic reaction, initial encounter  Urticaria    Rx / DC Orders ED Discharge Orders          Ordered    pantoprazole (PROTONIX) 20 MG tablet  Daily        11/23/22 1601    predniSONE (DELTASONE) 10 MG tablet  Daily        11/23/22 1601              Remi Deter 11/23/22 1649    Rondel Baton, MD 11/25/22 1228

## 2022-11-23 NOTE — Discharge Instructions (Addendum)
Please pick up the Protonix and steroids I have prescribed for you.  You may take Claritin or Zyrtec in the morning and Benadryl at night for your rash.  You may also use the Benadryl throughout the day if the itching becomes unbearable or you feel that your throat is closing up.  If you begin to feel your throat is closing up please return to ER or if symptoms begin to worsen.  Please monitor your daily lifestyle and take note of any new changes to identify the trigger.  Please follow-up with your primary care provider regarding recent symptoms and ER visit.  Today your labs and imaging were reassuring and since the chest pain has not occurred in over 6 hours only 1 troponin cardiac enzyme was ordered which was less than 2.

## 2022-11-23 NOTE — ED Notes (Signed)
Triage delay d/t pt in bathroom

## 2022-11-23 NOTE — Discharge Instructions (Signed)
Please go to the emergency room for further evaluation of your symptoms 

## 2022-11-24 ENCOUNTER — Other Ambulatory Visit: Payer: Self-pay | Admitting: Family Medicine

## 2022-11-24 ENCOUNTER — Telehealth: Payer: Self-pay

## 2022-11-24 DIAGNOSIS — Z79890 Hormone replacement therapy (postmenopausal): Secondary | ICD-10-CM

## 2022-11-24 NOTE — Telephone Encounter (Signed)
Noted, will evaluate as scheduled.  

## 2022-11-24 NOTE — Telephone Encounter (Signed)
Noted agree with evaluation in office tomorrow

## 2022-11-24 NOTE — Transitions of Care (Post Inpatient/ED Visit) (Signed)
I spoke with pt; pt said has not had problem wifh Genella Rife since seen in ED; pt said the rash is all over her body and itching is worse at times. Pt has been taking Prednisone 20 mg daily as prescribed and pt taking Benadryl which works to control itching for around 3 hrs. Pt is not what sure cause allergic reaction; pt hs not tried any new foods,meds or toiletries. Pt said about 1 hr after eating a protein bar with nuts that pt has eaten before (pt said bar was very oily) pt had swollen tongue for about 15 mins and that swelling went away and then few hours later pt broke out in rash all over body. Pt had appt already scheduled for 11/28/22 but since itching a lot rescheduled pt with Mayra Reel NP on 11/25/22 at 10:40 with UC & ED precautions and pt voiced understanding. Sending note to Allayne Gitelman NP who is out of office and Audria Nine NP who is in office.        11/24/2022  Name: Tara Parker MRN: [id number] DOB: 1965-08-02  Today's TOC FU Call Status: Today's TOC FU Call Status:: Successful TOC FU Call Competed TOC FU Call Complete Date: 11/24/22  Transition Care Management Follow-up Telephone Call Date of Discharge: 11/23/22 Discharge Facility: [facility] ([facility] Type of Discharge: Emergency Department Reason for ED Visit: Other: (GERD and developed rash unsure of origin) How have you been since you were released from the hospital?: Same (mostly feels the same but at times feels worse with itching due to rash; no issue with GERD since seen at ED.) Any questions or concerns?: No  Items Reviewed: Did you receive and understand the discharge instructions provided?: Yes Medications obtained,verified, and reconciled?: Yes (Medications Reviewed) (have been prednisone 20 mg daily and will have pantoprazole picked up today.) Any new allergies since your discharge?: No Dietary orders reviewed?: NA Do you have support at home?: Yes People in Home: spouse Name of Support/Comfort Primary  Source: Shawn  Medications Reviewed Today: Medications Reviewed Today     Reviewed by Solon Augusta, RN (Registered Nurse) on 11/23/22 at 1142  Med List Status: <None>   Medication Order Taking? Sig Documenting Provider Last Dose Status Informant  estradiol (ESTRACE) 0.5 MG tablet [id number]  Take 1 tablet (0.5 mg total) by mouth daily. Reva Bores, MD  Active   Multiple Vitamin (MULTIVITAMIN) capsule [id number]  Take 1 capsule by mouth daily. [provider]  Active   progesterone (PROMETRIUM) 100 MG capsule [id number]  Take 1 capsule (100 mg total) by mouth daily. Take 14-25 every month Reva Bores, MD  Active             Home Care and Equipment/Supplies: Were Home Health Services Ordered?: NA Any new equipment or medical supplies ordered?: NA  Functional Questionnaire: Do you need assistance with bathing/showering or dressing?: No Do you need assistance with meal preparation?: No Do you need assistance with eating?: No Do you have difficulty maintaining continence: No Do you need assistance with getting out of bed/getting out of a chair/moving?: No Do you have difficulty managing or taking your medications?: No  Follow up appointments reviewed: PCP Follow-up appointment confirmed?: Yes Date of PCP follow-up appointment?: 11/25/22 Follow-up Provider: Mayra Reel NP Specialist Hospital Follow-up appointment confirmed?: NA Do you need transportation to your follow-up appointment?: No Do you understand care options if your condition(s) worsen?: Yes-patient verbalized understanding    SIGNATURE Lewanda Rife, LPN

## 2022-11-25 ENCOUNTER — Encounter: Payer: Self-pay | Admitting: Primary Care

## 2022-11-25 ENCOUNTER — Ambulatory Visit: Payer: BC Managed Care – PPO | Admitting: Primary Care

## 2022-11-25 VITALS — BP 118/68 | HR 90 | Temp 98.4°F | Ht 65.5 in | Wt 139.0 lb

## 2022-11-25 DIAGNOSIS — L509 Urticaria, unspecified: Secondary | ICD-10-CM

## 2022-11-25 DIAGNOSIS — T7840XA Allergy, unspecified, initial encounter: Secondary | ICD-10-CM

## 2022-11-25 DIAGNOSIS — T7840XS Allergy, unspecified, sequela: Secondary | ICD-10-CM

## 2022-11-25 MED ORDER — PREDNISONE 20 MG PO TABS: ORAL_TABLET | ORAL | 0 refills | Status: DC

## 2022-11-25 MED ORDER — EPINEPHRINE 0.3 MG/0.3ML IJ SOAJ: 0.3000 mg | INTRAMUSCULAR | 0 refills | Status: DC | PRN

## 2022-11-25 NOTE — Patient Instructions (Signed)
Take an additional 40 mg of prednisone today.  Then start prednisone 20 mg and take 3 tablets for 2 more days, then 2 tablets for 3 days, then 1 tablet for 3 days.  Use the Epi Pen if needed.  Continue Allegra.  Stop by the lab prior to leaving today. I will notify you of your results once received.   It was a pleasure to see you today!

## 2022-11-25 NOTE — Progress Notes (Signed)
Subjective:    Patient ID: Tara Parker, female    DOB: [birth year], 57 y.o.   MRN: [id number]  HPI  Tara Parker is a very pleasant 57 y.o. female who presents today   She presented to Urgent Care in [place] on 11/23/22 with multiple symptoms including difficulty swallowing, centralized chest pain/pressure, sore throat with pruritic red rash from waistline, torso, and upper/lower extremities. During her visit she underwent chest xray and rapid strep test which were negative. She was advised to proceed to the ED for further evaluation.  She presented to [facility] ED that same day. She underwent ECG (negative for acute abnormality), labs (negative troponin and negative UA). She was treated for an allergic reaction with benadryl, Solu-Medrol, Pepcid. Her acid reflux was presumed to be the cause of her chest pain, and her acid reflux was suspected to be a reaction from her allergic reaction. She was discharged home with a prescription for Protonix and prednisone.   Since her ED visit she continues to experience allergic reaction symptoms. She's having to take Benadryl every 4 hours for her symptoms of hives, throat closure. Her rash appears to her extremities, groin, anterior and posterior trunk, her feet, scalp.   She suspects her allergic reaction was from eating a protein bar, but she's confused as she's eaten the protein bar before. She denies changes in anything including food/soaps/detergents. She's not seen any ticks on her skin. She did eat beef prior to her reaction.   Her esophageal reflux symptoms resolved, she never took Protonix.   Review of Systems  HENT:  Positive for trouble swallowing.   Respiratory:  Negative for chest tightness and shortness of breath.   Skin:  Positive for rash.         Past Medical History:  Diagnosis Date   Chicken pox    Postmenopausal vaginal bleeding    Shingles     Social History   Socioeconomic History   Marital status: Married     Spouse name: Not on file   Number of children: Not on file   Years of education: Not on file   Highest education level: Associate degree: occupational, Scientist, product/process development, or vocational program  Occupational History   Not on file  Tobacco Use   Smoking status: Former    Types: Cigarettes   Smokeless tobacco: Never   Tobacco comments:    Teen years   Vaping Use   Vaping Use: Never used  Substance and Sexual Activity   Alcohol use: Yes    Comment: rare   Drug use: No   Sexual activity: Yes    Partners: Male    Birth control/protection: None    Comment: Married  Other Topics Concern   Not on file  Social History Narrative   Married.   1 child, age 54.   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   Enjoys reading, exercise, being outdoors.   Social Determinants of Health   Financial Resource Strain: Not on file  Food Insecurity: No Food Insecurity (11/24/2022)   Hunger Vital Sign    Worried About Running Out of Food in the Last Year: Never true    Ran Out of Food in the Last Year: Never true  Transportation Needs: No Transportation Needs (11/24/2022)   PRAPARE - Administrator, Civil Service (Medical): No    Lack of Transportation (Non-Medical): No  Physical Activity: Sufficiently Active (11/24/2022)   Exercise Vital  Sign    Days of Exercise per Week: 4 days    Minutes of Exercise per Session: 50 min  Stress: No Stress Concern Present (11/24/2022)   [employer] of Occupational Health - Occupational Stress Questionnaire    Feeling of Stress : Only a little  Social Connections: Moderately Isolated (11/24/2022)   Social Connection and Isolation Panel [NHANES]    Frequency of Communication with Friends and Family: Once a week    Frequency of Social Gatherings with Friends and Family: Once a week    Attends Religious Services: More than 4 times per year    Active Member of [employer] or Organizations: No    Attends Hospital doctor: Not on file    Marital Status: Married  Catering manager Violence: Not on file    Past Surgical History:  Procedure Laterality Date   BREAST ENHANCEMENT SURGERY     CHOLECYSTECTOMY      Family History  Problem Relation Age of Onset   Heart disease Mother    Heart attack Mother    Cancer Maternal Grandmother 27       Uterine    Allergies  Allergen Reactions   Penicillins     Current Outpatient Medications on File Prior to Visit  Medication Sig Dispense Refill   estradiol (ESTRACE) 0.5 MG tablet Take 1 tablet (0.5 mg total) by mouth daily. 90 tablet 3   Multiple Vitamin (MULTIVITAMIN) capsule Take 1 capsule by mouth daily.     progesterone (PROMETRIUM) 100 MG capsule Take 1 capsule (100 mg total) by mouth daily. Take 14-25 every month 36 capsule 3   pantoprazole (PROTONIX) 20 MG tablet Take 1 tablet (20 mg total) by mouth daily for 10 days. (Patient not taking: Reported on 11/25/2022) 10 tablet 0   No current facility-administered medications on file prior to visit.    BP 118/68   Pulse 90   Temp 98.4 F (36.9 C) (Temporal)   Ht 5' 5.5" (1.664 m)   Wt 139 lb (63 kg)   LMP 12/03/2014   SpO2 98%   BMI 22.78 kg/m  Objective:   Physical Exam Constitutional:      General: She is not in acute distress. Cardiovascular:     Rate and Rhythm: Normal rate and regular rhythm.  Pulmonary:     Effort: Pulmonary effort is normal.     Breath sounds: Normal breath sounds.  Musculoskeletal:     Cervical back: Neck supple.  Skin:    General: Skin is warm and dry.     Findings: Rash present.     Comments: Hives notes to anterior and posterior trunk, upper and lower extremities.            Assessment & Plan:  Allergic reaction, sequela Assessment & Plan: Unclear etiology at this point. Urgent Care and ED notes and labs reviewed.   Will complete food allergy testing and also testing for alpha gal.   Increase prednisone course to 60 mg x 2 more days, then 40  mg x 3 days, then 20 mg x 3 days. Continue Allegra. Consider adding Pepcid.  Rx for Epi Pen provided, instructed on how to use.  She will update in a few days.  Orders: -     Food Allergy Profile -     Alpha-Gal Panel -     predniSONE; Take 3 tablets by mouth daily for two days, then 2 tablets for three days, then 1 tablet for three days.  Dispense: 15  tablet; Refill: 0 -     EPINEPHrine; Inject 0.3 mg into the muscle as needed for anaphylaxis.  Dispense: 1 each; Refill: 0  Full body hives -     Food Allergy Profile -     Alpha-Gal Panel -     predniSONE; Take 3 tablets by mouth daily for two days, then 2 tablets for three days, then 1 tablet for three days.  Dispense: 15 tablet; Refill: 0 -     EPINEPHrine; Inject 0.3 mg into the muscle as needed for anaphylaxis.  Dispense: 1 each; Refill: 0        Doreene Nest, NP

## 2022-11-25 NOTE — Assessment & Plan Note (Addendum)
Unclear etiology at this point. Urgent Care and ED notes and labs reviewed.   Will complete food allergy testing and also testing for alpha gal.   Increase prednisone course to 60 mg x 2 more days, then 40 mg x 3 days, then 20 mg x 3 days. Continue Allegra. Consider adding Pepcid.  Rx for Epi Pen provided, instructed on how to use.  She will update in a few days.

## 2022-11-27 ENCOUNTER — Ambulatory Visit: Payer: BC Managed Care – PPO

## 2022-11-27 ENCOUNTER — Other Ambulatory Visit: Payer: Self-pay | Admitting: Family Medicine

## 2022-11-27 DIAGNOSIS — Z79890 Hormone replacement therapy (postmenopausal): Secondary | ICD-10-CM

## 2022-11-28 ENCOUNTER — Ambulatory Visit: Payer: BC Managed Care – PPO | Admitting: Primary Care

## 2022-12-04 ENCOUNTER — Ambulatory Visit: Payer: BC Managed Care – PPO

## 2022-12-05 LAB — FOOD ALLERGY PROFILE
Allergen, Salmon, f41: <0.1 kU/L
Almonds: <0.1 kU/L
CLASS: 0
CLASS: 0
CLASS: 0
CLASS: 0
CLASS: 0
CLASS: 0
CLASS: 0
CLASS: 0
CLASS: 0
CLASS: 0
CLASS: 0
Cashew IgE: <0.1 kU/L
Class: 0
Class: 0
Class: 0
Class: 0
Egg White IgE: <0.1 kU/L
Fish Cod: <0.1 kU/L
Hazelnut: <0.1 kU/L
Milk IgE: <0.1 kU/L
Peanut IgE: <0.1 kU/L
Scallop IgE: <0.1 kU/L
Sesame Seed f10: <0.1 kU/L
Shrimp IgE: <0.1 kU/L
Soybean IgE: <0.1 kU/L
Tuna IgE: <0.1 kU/L
Walnut: <0.1 kU/L
Wheat IgE: <0.1 kU/L

## 2022-12-05 LAB — ALPHA-GAL PANEL
Allergen, Mutton, f88: <0.1 kU/L
Allergen, Pork, f26: <0.1 kU/L
Beef: <0.1 kU/L
CLASS: 0
CLASS: 0
Class: 0
GALACTOSE-ALPHA-1,3-GALACTOSE IGE*: <0.1 kU/L (ref <0.10)

## 2022-12-05 LAB — INTERPRETATION:

## 2022-12-23 ENCOUNTER — Telehealth: Payer: Self-pay | Admitting: Primary Care

## 2022-12-23 DIAGNOSIS — M1611 Unilateral primary osteoarthritis, right hip: Secondary | ICD-10-CM

## 2022-12-23 NOTE — Telephone Encounter (Signed)
Can you call Tara Parker and let her know that I put the orders in for her fluoro-guided hip injection.  She should be contacted directly by [facility].

## 2022-12-23 NOTE — Telephone Encounter (Signed)
Called reviewed information with patient will call If any questions.

## 2022-12-23 NOTE — Telephone Encounter (Signed)
Patient contacted the office regarding orders put in by [name] for an injection in hip for arthritis. Patient says sh goes to [facility] every 6 months for this, states she is in need of another order to be sent in for this injection. Patient was unsure the name of the injection and I could not find anything as well. Please advise, thank you. Sent to pcp and provider who ordered

## 2022-12-23 NOTE — Addendum Note (Signed)
Addended by: Hannah Beat on: 12/23/2022 02:09 PM   Modules accepted: Orders

## 2023-01-05 ENCOUNTER — Ambulatory Visit
Admission: RE | Admit: 2023-01-05 | Discharge: 2023-01-05 | Disposition: A | Discharge status: Home / Self Care | Payer: BC Managed Care – PPO | Source: Ambulatory Visit | Attending: Family Medicine | Admitting: Family Medicine

## 2023-01-05 DIAGNOSIS — M1611 Unilateral primary osteoarthritis, right hip: Secondary | ICD-10-CM

## 2023-01-05 MED ORDER — METHYLPREDNISOLONE ACETATE 40 MG/ML INJ SUSP (RADIOLOG
80.0000 mg | Freq: Once | INTRAMUSCULAR | Status: AC
  Administered 2023-01-05: 80 mg via INTRA_ARTICULAR

## 2023-01-05 MED ORDER — IOPAMIDOL (ISOVUE-M 200) INJECTION 41%
1.0000 mL | Freq: Once | INTRAMUSCULAR | Status: AC
  Administered 2023-01-05: 1 mL via INTRA_ARTICULAR

## 2023-01-15 ENCOUNTER — Telehealth: Payer: Self-pay | Admitting: Primary Care

## 2023-01-15 NOTE — Telephone Encounter (Signed)
Taryne notified as instructed by telephone. 

## 2023-01-15 NOTE — Telephone Encounter (Signed)
Patient called in stating that she goes to [facility] for the injection in her right hip twice a year,she said that each time she goes she doesn't get the relief from it until  after 2to3 days. She stated that she got the injection 1 week ago,and it has not helped at all and she's worried that they didn't put the injection in the right place this time. She would like a call just to reassure her if this is normal or if it may have been placed in  he wrong spot this time?

## 2023-01-15 NOTE — Telephone Encounter (Signed)
Can you return her call  I read the procedure note, and since they use fluoroscopy to identify the joint, it should guarantee that it entered the joint, and they said that .   No procedure is 100% accurate, so even if it is unlikely, it is potentially possible that it didn't go to the right spot.  It would be unusual, though, since it was under fluoro.

## 2023-01-15 NOTE — Telephone Encounter (Signed)
Will forward to treating physician 

## 2023-05-15 ENCOUNTER — Telehealth: Payer: Self-pay | Admitting: Primary Care

## 2023-05-15 DIAGNOSIS — M1611 Unilateral primary osteoarthritis, right hip: Secondary | ICD-10-CM

## 2023-05-15 NOTE — Telephone Encounter (Signed)
Bora notified as instructed by telephone.

## 2023-05-15 NOTE — Telephone Encounter (Signed)
Can you let her know I sent in that referral, and I asked to see if Dr. Karin Golden could do it for her.

## 2023-05-15 NOTE — Telephone Encounter (Signed)
Patient would like to know if Dr Patsy Lager could send out the referral to [facility] for her hip injection ,as he does for her often.She said per their conversation patient said that Dr Patsy Lager would send referral to the same doctor that did  the injection the very first time,due to the last one that she seen may have not did it correctly because it did not work.

## 2023-05-22 ENCOUNTER — Telehealth: Payer: Self-pay | Admitting: Primary Care

## 2023-05-22 ENCOUNTER — Encounter: Payer: Self-pay | Admitting: *Deleted

## 2023-05-22 ENCOUNTER — Encounter: Payer: Self-pay | Admitting: Primary Care

## 2023-05-22 ENCOUNTER — Ambulatory Visit (INDEPENDENT_AMBULATORY_CARE_PROVIDER_SITE_OTHER): Payer: Self-pay | Admitting: Primary Care

## 2023-05-22 VITALS — BP 108/66 | HR 70 | Temp 98.3°F | Ht 65.5 in | Wt 133.1 lb

## 2023-05-22 DIAGNOSIS — L509 Urticaria, unspecified: Secondary | ICD-10-CM | POA: Diagnosis not present

## 2023-05-22 DIAGNOSIS — T7840XS Allergy, unspecified, sequela: Secondary | ICD-10-CM | POA: Diagnosis not present

## 2023-05-22 MED ORDER — METHYLPREDNISOLONE ACETATE 80 MG/ML IJ SUSP
80.0000 mg | Freq: Once | INTRAMUSCULAR | Status: AC
Start: 2023-05-22 — End: 2023-05-22
  Administered 2023-05-22: 80 mg via INTRAMUSCULAR

## 2023-05-22 MED ORDER — PREDNISONE 20 MG PO TABS
ORAL_TABLET | ORAL | 0 refills | Status: DC
Start: 2023-05-22 — End: 2023-08-23

## 2023-05-22 NOTE — Progress Notes (Signed)
Subjective:    Patient ID: Tara Parker, female    DOB: [birth year], 57 y.o.   MRN: [id number]  Rash Pertinent negatives include no shortness of breath.    Tara Parker is a very pleasant 57 y.o. female with a history of allergic reaction and anxiety who presents today to discuss rash.  Evaluated in May 2024 for urgent care and emergency department follow-up with symptoms suggestive of allergic reaction.  During her visit in May she continued to experience symptoms of dysphagia, centralized chest pain, sore throat, pruritic rash from waistline, torso, upper and lower extremities.  During this visit she underwent food allergy and alpha gal testing for which was negative.  Her prednisone was increased accordingly and she was encouraged to continue Allegra and potentially add Pepcid.  She was also prescribed an EpiPen to use in the future if needed.  After her last visit her rash and symptoms resolved. Symptom onset early this morning with scalp and neck itching. Later today her husband noticed a rash to her posterior neck. She continues to itch to her scalp, posterior neck and bilateral neck. She has noticed mild tightness to the upper eye. She's taken 50 mg of Benadryl earlier this morning and Allegra 180 mg this afternoon.   She denies chest pressure, rash elsewhere, shortness of breath, chest tightness, scratchy throat, wheezing. She denies changes in her diet, new supplements, new pets, changes in soap/detergents. She has not see an allergist.    Review of Systems  HENT:  Negative for trouble swallowing.   Respiratory:  Negative for chest tightness, shortness of breath and wheezing.   Skin:  Positive for rash.         Past Medical History:  Diagnosis Date   Chicken pox    Postmenopausal vaginal bleeding    Shingles     Social History   Socioeconomic History   Marital status: Married    Spouse name: Not on file   Number of children: Not on file   Years of education: Not  on file   Highest education level: Associate degree: occupational, Scientist, product/process development, or vocational program  Occupational History   Not on file  Tobacco Use   Smoking status: Former    Types: Cigarettes   Smokeless tobacco: Never   Tobacco comments:    Teen years   Vaping Use   Vaping status: Never Used  Substance and Sexual Activity   Alcohol use: Yes    Comment: rare   Drug use: No   Sexual activity: Yes    Partners: Male    Birth control/protection: None    Comment: Married  Other Topics Concern   Not on file  Social History Narrative   Married.   1 child, age 51.   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   Enjoys reading, exercise, being outdoors.   Social Determinants of Health   Financial Resource Strain: Not on file  Food Insecurity: No Food Insecurity (11/24/2022)   Hunger Vital Sign    Worried About Running Out of Food in the Last Year: Never true    Ran Out of Food in the Last Year: Never true  Transportation Needs: No Transportation Needs (11/24/2022)   PRAPARE - Administrator, Civil Service (Medical): No    Lack of Transportation (Non-Medical): No  Physical Activity: Sufficiently Active (11/24/2022)   Exercise Vital Sign    Days of Exercise per Week: 4 days  Minutes of Exercise per Session: 50 min  Stress: No Stress Concern Present (11/24/2022)   [employer] of Occupational Health - Occupational Stress Questionnaire    Feeling of Stress : Only a little  Social Connections: Moderately Isolated (11/24/2022)   Social Connection and Isolation Panel [NHANES]    Frequency of Communication with Friends and Family: Once a week    Frequency of Social Gatherings with Friends and Family: Once a week    Attends Religious Services: More than 4 times per year    Active Member of [employer] or Organizations: No    Attends Engineer, structural: Not on file    Marital Status: Married  Catering manager Violence: Not  on file    Past Surgical History:  Procedure Laterality Date   BREAST ENHANCEMENT SURGERY     CHOLECYSTECTOMY      Family History  Problem Relation Age of Onset   Heart disease Mother    Heart attack Mother    Cancer Maternal Grandmother 27       Uterine    Allergies  Allergen Reactions   Penicillins     Current Outpatient Medications on File Prior to Visit  Medication Sig Dispense Refill   estradiol (ESTRACE) 0.5 MG tablet TAKE 1 TABLET BY MOUTH EVERY DAY 90 tablet 3   Multiple Vitamin (MULTIVITAMIN) capsule Take 1 capsule by mouth daily.     progesterone (PROMETRIUM) 100 MG capsule TAKE 1 CAPSULE (100 MG TOTAL) BY MOUTH DAILY. TAKE 14-25 EVERY MONTH 36 capsule 3   No current facility-administered medications on file prior to visit.    BP 108/66 (BP Location: Right Arm, Patient Position: Sitting, Cuff Size: Normal)   Pulse 70   Temp 98.3 F (36.8 C) (Oral)   Ht 5' 5.5" (1.664 m)   Wt 133 lb 2 oz (60.4 kg)   LMP 12/03/2014   SpO2 97%   BMI 21.82 kg/m  Objective:   Physical Exam Constitutional:      General: She is not in acute distress. Cardiovascular:     Rate and Rhythm: Normal rate and regular rhythm.  Pulmonary:     Effort: Pulmonary effort is normal.     Breath sounds: Normal breath sounds. No wheezing.  Skin:    General: Skin is warm and dry.     Findings: Rash present.     Comments: Hives noted to posterior neck, right lateral neck, left lateral neck, upper mid thoracic region.  Neurological:     Mental Status: She is alert.           Assessment & Plan:  Hives Assessment & Plan: New onset. Mild to moderate today.  Unclear etiology.  Reviewed allergy testing from May 2024.  Start prednisone 20 mg tablets. Take 3 tablets by mouth daily in the morning x 3 days, then 2 tablets x 3 days, then 1 tablet x 3 days. Continue Allegra.  Referral placed to allergist.  Follow-up as needed.  Orders: -     predniSONE; Take 3 tablets by mouth daily  in the morning x 3 days, then 2 tablets x 3 days, then 1 tablet x 3 days.  Dispense: 18 tablet; Refill: 0 -     Ambulatory referral to Allergy -     methylPREDNISolone Acetate  Allergic reaction, sequela -     Ambulatory referral to Allergy -     methylPREDNISolone Acetate        Doreene Nest, NP

## 2023-05-22 NOTE — Patient Instructions (Signed)
Start prednisone tomorrow morning for hives. Take 3 tablets by mouth daily in the morning x 3 days, then 2 tablets x 3 days, then 1 tablet x 3 days.  You will either be contacted via phone regarding your referral to the allergist, or you may receive a letter on your MyChart portal from our referral team with instructions for scheduling an appointment. Please let us know if you have not been contacted by anyone within two weeks.  Continue Allegra.  It was a pleasure to see you today!

## 2023-05-22 NOTE — Telephone Encounter (Signed)
Jae Dire to see pt today at 3:40p, pt aware.

## 2023-05-22 NOTE — Telephone Encounter (Signed)
Noted, will evaluate. 

## 2023-05-22 NOTE — Telephone Encounter (Signed)
FYI: This call has been transferred to Access Nurse. Once the result note has been entered staff can address the message at that time.  Patient called in with the following symptoms:  Red Word: allergic reaction   Please advise at Mobile [phone] (mobile)  Message is routed to Provider Pool and [facility] Triage    Pt called stating a few months ago she had a rash & what was thought to be a allergic reaction to something. Pt states after running test, no allergies were ever found. Pt states she now has a rash around the side & back of her neck, along with itchiness on her head. Pt states she believes she having the same reaction as before. Pt mentioned she hasn't changed her eating or drinking, so she isn't sure why she's having a flare. No available slots for today, transferred pt to access nurse.

## 2023-05-22 NOTE — Assessment & Plan Note (Signed)
New onset. Mild to moderate today.  Unclear etiology.  Reviewed allergy testing from May 2024.  Start prednisone 20 mg tablets. Take 3 tablets by mouth daily in the morning x 3 days, then 2 tablets x 3 days, then 1 tablet x 3 days. Continue Allegra.  Referral placed to allergist.  Follow-up as needed.

## 2023-06-01 ENCOUNTER — Inpatient Hospital Stay
Admission: RE | Admit: 2023-06-01 | Discharge: 2023-06-01 | Disposition: A | Discharge status: Home / Self Care | Payer: BC Managed Care – PPO | Source: Ambulatory Visit | Attending: Family Medicine | Admitting: Family Medicine

## 2023-06-01 DIAGNOSIS — M25551 Pain in right hip: Secondary | ICD-10-CM | POA: Diagnosis not present

## 2023-06-01 DIAGNOSIS — M1611 Unilateral primary osteoarthritis, right hip: Secondary | ICD-10-CM

## 2023-06-01 MED ORDER — METHYLPREDNISOLONE ACETATE 40 MG/ML INJ SUSP (RADIOLOG
80.0000 mg | Freq: Once | INTRAMUSCULAR | Status: AC
  Administered 2023-06-01: 80 mg via INTRA_ARTICULAR

## 2023-06-01 MED ORDER — IOPAMIDOL (ISOVUE-M 200) INJECTION 41%
1.0000 mL | Freq: Once | INTRAMUSCULAR | Status: AC
  Administered 2023-06-01: 1 mL via INTRA_ARTICULAR

## 2023-06-30 DIAGNOSIS — J300 Vasomotor rhinitis: Secondary | ICD-10-CM | POA: Diagnosis not present

## 2023-06-30 DIAGNOSIS — R21 Rash and other nonspecific skin eruption: Secondary | ICD-10-CM | POA: Diagnosis not present

## 2023-07-10 ENCOUNTER — Ambulatory Visit (INDEPENDENT_AMBULATORY_CARE_PROVIDER_SITE_OTHER): Payer: BC Managed Care – PPO | Admitting: Internal Medicine

## 2023-07-10 VITALS — BP 122/68 | HR 85 | Temp 99.0°F | Ht 65.5 in | Wt 133.0 lb

## 2023-07-10 DIAGNOSIS — J019 Acute sinusitis, unspecified: Secondary | ICD-10-CM | POA: Insufficient documentation

## 2023-07-10 MED ORDER — DOXYCYCLINE HYCLATE 100 MG PO TABS
100.0000 mg | ORAL_TABLET | Freq: Two times a day (BID) | ORAL | 0 refills | Status: DC
Start: 2023-07-10 — End: 2023-08-26

## 2023-07-10 NOTE — Assessment & Plan Note (Signed)
Mild to mod, for antibx course doxycycline 100 bid,  to f/u any worsening symptoms or concerns 

## 2023-07-10 NOTE — Progress Notes (Signed)
Patient ID: Tara Parker, female   DOB: 1966-01-28, 57 y.o.   MRN: [id number]        Chief Complaint: follow up with sinusitis       HPI:  Tara Parker is a 57 y.o. female  Here with 2-3 days acute onset fever, facial pain, pressure, headache, general weakness and malaise, and greenish d/c, with mild ST and cough, but pt denies chest pain, wheezing, increased sob or doe, orthopnea, PND, increased LE swelling, palpitations, dizziness or syncope.  Pt denies polydipsia, polyuria, or new focal neuro s/s.    Pt denies wt loss, night sweats, loss of appetite, or other constitutional symptoms        Wt Readings from Last 3 Encounters:  07/10/23 133 lb (60.3 kg)  05/22/23 133 lb 2 oz (60.4 kg)  11/25/22 139 lb (63 kg)   BP Readings from Last 3 Encounters:  07/10/23 122/68  05/22/23 108/66  11/25/22 118/68         Past Medical History:  Diagnosis Date   Chicken pox    Postmenopausal vaginal bleeding    Shingles    Past Surgical History:  Procedure Laterality Date   BREAST ENHANCEMENT SURGERY     CHOLECYSTECTOMY      reports that she has quit smoking. Her smoking use included cigarettes. She has never used smokeless tobacco. She reports current alcohol use. She reports that she does not use drugs. family history includes Cancer (age of onset: 61) in her maternal grandmother; Heart attack in her mother; Heart disease in her mother. Allergies  Allergen Reactions   Penicillins    Current Outpatient Medications on File Prior to Visit  Medication Sig Dispense Refill   estradiol (ESTRACE) 0.5 MG tablet TAKE 1 TABLET BY MOUTH EVERY DAY 90 tablet 3   Multiple Vitamin (MULTIVITAMIN) capsule Take 1 capsule by mouth daily.     progesterone (PROMETRIUM) 100 MG capsule TAKE 1 CAPSULE (100 MG TOTAL) BY MOUTH DAILY. TAKE 14-25 EVERY MONTH 36 capsule 3   predniSONE (DELTASONE) 20 MG tablet Take 3 tablets by mouth daily in the morning x 3 days, then 2 tablets x 3 days, then 1 tablet x 3 days.  (Patient not taking: Reported on 07/10/2023) 18 tablet 0   No current facility-administered medications on file prior to visit.        ROS:  All others reviewed and negative.  Objective        PE:  BP 122/68 (BP Location: Right Arm, Patient Position: Sitting, Cuff Size: Normal)   Pulse 85   Temp 99 F (37.2 C) (Oral)   Ht 5' 5.5" (1.664 m)   Wt 133 lb (60.3 kg)   LMP 12/03/2014   SpO2 99%   BMI 21.80 kg/m                 Constitutional: Pt appears mild ill               HENT: Head: NCAT.                Right Ear: External ear normal.                 Left Ear: External ear normal. Bilat tm's with mild erythema.  Right Max sinus areas mod tender.  Pharynx with mild erythema, no exudate                Eyes: . Pupils are equal, round, and reactive to light. Conjunctivae and EOM are normal  Nose: without d/c or deformity               Neck: Neck supple. Gross normal ROM               Cardiovascular: Normal rate and regular rhythm.                 Pulmonary/Chest: Effort normal and breath sounds without rales or wheezing.                Neurological: Pt is alert. At baseline orientation, motor grossly intact               Skin: Skin is warm. No rashes, no other new lesions, LE edema - none               Psychiatric: Pt behavior is normal without agitation   Micro: none  Cardiac tracings I have personally interpreted today:  none  Pertinent Radiological findings (summarize): none   Lab Results  Component Value Date   WBC 9.3 11/23/2022   HGB 16.4 (H) 11/23/2022   HCT 46.3 (H) 11/23/2022   PLT 170 11/23/2022   GLUCOSE 107 (H) 11/23/2022   CHOL 158 11/05/2021   TRIG 50.0 11/05/2021   HDL 51.40 11/05/2021   LDLCALC 96 11/05/2021   ALT 33 03/22/2022   AST 35 03/22/2022   NA 141 11/23/2022   K 4.0 11/23/2022   CL 104 11/23/2022   CREATININE 1.06 (H) 11/23/2022   BUN 19 11/23/2022   CO2 32 11/23/2022   Assessment/Plan:  Tara Parker is a 57 y.o. White or  Caucasian [1] female with  has a past medical history of Chicken pox, Postmenopausal vaginal bleeding, and Shingles.  Acute sinus infection Mild to mod, for antibx course doxycycline 100 bid,  to f/u any worsening symptoms or concerns  Followup: Return if symptoms worsen or fail to improve.  Oliver Barre, MD 07/10/2023 1:36 PM [facility]

## 2023-07-10 NOTE — Patient Instructions (Signed)
Please take all new medication as prescribed - the antibiotic  You can also take Delsym OTC for cough, and/or Mucinex (or it's generic off brand) for congestion, and tylenol as needed for pain.  Please continue all other medications as before, and refills have been done if requested.  Please have the pharmacy call with any other refills you may need.  Please keep your appointments with your specialists as you may have planned    

## 2023-08-06 ENCOUNTER — Encounter: Payer: BC Managed Care – PPO | Admitting: Primary Care

## 2023-08-23 ENCOUNTER — Telehealth: Payer: Self-pay | Admitting: Family Medicine

## 2023-08-23 NOTE — Telephone Encounter (Signed)
Lupita Leash, can you double-check with Kehlani and make sure that she wants to see my for her complete physical?  I have seen her a lot for [facility] reasons, but I think that Mayra Reel is her PCP?

## 2023-08-24 NOTE — Telephone Encounter (Signed)
LVM for patient to cb and get an understanding regarding what her appointment should be for.

## 2023-08-25 NOTE — Progress Notes (Signed)
 Tara Crilly T. Caylan Chenard, MD, CAQ Sports Medicine [facility] [address] [place], [address]  Phone: [phone]  FAX: [phone]  Tara Parker - 58 y.o. female  MRN [id number]  Date of Birth: 01-21-1966  Date: 08/26/2023  PCP: Gretta Comer POUR, NP  Referral: Gretta Comer POUR, NP  Chief Complaint  Patient presents with   Hip Pain    Right   Subjective:   Tara Parker is a 58 y.o. very pleasant female patient with Body mass index is 22.94 kg/m. who presents with the following:  Tara Parker is here to follow-up about her hip arthritis.  She does have significant hip arthritis on the right side.  We have been doing intermittent intra-articular fluoroscopy guided hip injections with some good relief.  She has really been struggling with a lot of pain in the right hip and in the anterior groin.  She denies back pain, buttocks pain and radicular pain or numbness.  She is really been taking a lot of ibuprofen right now, taking anywhere from 600 to 800 mg every 3-4 hours.  She is very strong in general.  She is quite versed and strengthening, and she has been doing a physician supervised rehab program for more than a year.  Review of Systems is noted in the HPI, as appropriate  Objective:   BP 100/64 (BP Location: Left Arm, Patient Position: Sitting, Cuff Size: Normal)   Pulse 84   Temp 98.2 F (36.8 C) (Temporal)   Ht 5' 5.5 (1.664 m)   Wt 140 lb (63.5 kg)   LMP 12/03/2014   SpO2 97%   BMI 22.94 kg/m   GEN: No acute distress; alert,appropriate. PULM: Breathing comfortably in no respiratory distress PSYCH: Normally interactive.    HIP EXAM: SIDE: R ROM: Abduction, Flexion, Internal and External range of motion: Compared to the contralateral side, she has a roughly 20% decreased range of motion compared. Pain with terminal IROM and EROM: Notable pain with terminal rotation, worse with internal range of motion FADIR testing is  positive GTB: NT SLR: NEG Knees: No effusion FABER: NT REVERSE FABER: NT, neg Piriformis: NT at direct palpation Str: flexion: 4/5 abduction: 4/5 adduction: 4/5 Strength testing non-tender, but it is notably worse compared to the contralateral side    Laboratory and Imaging Data:  Assessment and Plan:     ICD-10-CM   1. Primary localized osteoarthritis of right hip  M16.11 DG Hip Unilat W OR W/O Pelvis 2-3 Views Right    2. Chronic right hip pain  M25.551 DG Hip Unilat W OR W/O Pelvis 2-3 Views Right   G89.29 MR HIP RIGHT W CONTRAST    DG FLUORO GUIDED NEEDLE PLC ASPIRATION/INJECTION LOC     I she has had some progression of her right-sided hip osteoarthritis compared to her prior from 2 years ago.  She has failed multiple conservative measures including NSAIDs and multiple intra-articular hip injection.  With the notable restriction of motion in the range of internal range of motion and a positive FADIR, I want to obtain an MR arthrogram of the right hip to evaluate for possible labral tear.  Medication Management during today's office visit: Meds ordered this encounter  Medications   diclofenac  (VOLTAREN ) 75 MG EC tablet    Sig: Take 1 tablet (75 mg total) by mouth 2 (two) times daily.    Dispense:  60 tablet    Refill:  3   Medications Discontinued During This Encounter  Medication Reason  predniSONE  (DELTASONE ) 20 MG tablet    doxycycline  (VIBRA -TABS) 100 MG tablet Completed Course    Orders placed today for conditions managed today: Orders Placed This Encounter  Procedures   DG Hip Unilat W OR W/O Pelvis 2-3 Views Right   MR HIP RIGHT W CONTRAST   DG FLUORO GUIDED NEEDLE PLC ASPIRATION/INJECTION LOC    Disposition: No follow-ups on file.  [employer] One speech-to-text software was used for transcription in this dictation.  Possible transcriptional errors can occur using Animal nutritionist.   Signed,  Jacques DASEN. Lindalee Huizinga, MD   Outpatient Encounter  Medications as of 08/26/2023  Medication Sig   diclofenac  (VOLTAREN ) 75 MG EC tablet Take 1 tablet (75 mg total) by mouth 2 (two) times daily.   estradiol  (ESTRACE ) 0.5 MG tablet TAKE 1 TABLET BY MOUTH EVERY DAY   Multiple Vitamin (MULTIVITAMIN) capsule Take 1 capsule by mouth daily.   progesterone  (PROMETRIUM ) 100 MG capsule TAKE 1 CAPSULE (100 MG TOTAL) BY MOUTH DAILY. TAKE 14-25 EVERY MONTH   [DISCONTINUED] doxycycline  (VIBRA -TABS) 100 MG tablet Take 1 tablet (100 mg total) by mouth 2 (two) times daily.   [DISCONTINUED] predniSONE  (DELTASONE ) 20 MG tablet Take 3 tablets by mouth daily in the morning x 3 days, then 2 tablets x 3 days, then 1 tablet x 3 days. (Patient not taking: Reported on 07/10/2023)   No facility-administered encounter medications on file as of 08/26/2023.

## 2023-08-26 ENCOUNTER — Encounter: Payer: Self-pay | Admitting: Family Medicine

## 2023-08-26 ENCOUNTER — Ambulatory Visit (INDEPENDENT_AMBULATORY_CARE_PROVIDER_SITE_OTHER): Payer: BLUE CROSS/BLUE SHIELD | Admitting: Family Medicine

## 2023-08-26 ENCOUNTER — Ambulatory Visit (INDEPENDENT_AMBULATORY_CARE_PROVIDER_SITE_OTHER)
Admission: RE | Admit: 2023-08-26 | Discharge: 2023-08-26 | Disposition: A | Discharge status: Home / Self Care | Payer: BLUE CROSS/BLUE SHIELD | Source: Ambulatory Visit | Attending: Family Medicine | Admitting: Family Medicine

## 2023-08-26 VITALS — BP 100/64 | HR 84 | Temp 98.2°F | Ht 65.5 in | Wt 140.0 lb

## 2023-08-26 DIAGNOSIS — M1611 Unilateral primary osteoarthritis, right hip: Secondary | ICD-10-CM

## 2023-08-26 DIAGNOSIS — M25551 Pain in right hip: Secondary | ICD-10-CM | POA: Diagnosis not present

## 2023-08-26 DIAGNOSIS — G8929 Other chronic pain: Secondary | ICD-10-CM

## 2023-08-26 MED ORDER — DICLOFENAC SODIUM 75 MG PO TBEC
75.0000 mg | DELAYED_RELEASE_TABLET | Freq: Two times a day (BID) | ORAL | 3 refills | Status: DC
Start: 2023-08-26 — End: 2023-09-30

## 2023-08-27 ENCOUNTER — Encounter: Payer: Self-pay | Admitting: Primary Care

## 2023-08-27 ENCOUNTER — Ambulatory Visit: Payer: BLUE CROSS/BLUE SHIELD

## 2023-08-27 ENCOUNTER — Other Ambulatory Visit: Payer: Self-pay | Admitting: Primary Care

## 2023-08-27 ENCOUNTER — Ambulatory Visit (INDEPENDENT_AMBULATORY_CARE_PROVIDER_SITE_OTHER): Payer: BLUE CROSS/BLUE SHIELD | Admitting: Primary Care

## 2023-08-27 VITALS — BP 102/60 | HR 79 | Temp 97.9°F | Ht 65.5 in | Wt 139.0 lb

## 2023-08-27 DIAGNOSIS — F419 Anxiety disorder, unspecified: Secondary | ICD-10-CM | POA: Diagnosis not present

## 2023-08-27 DIAGNOSIS — Z79890 Hormone replacement therapy (postmenopausal): Secondary | ICD-10-CM

## 2023-08-27 DIAGNOSIS — Z0000 Encounter for general adult medical examination without abnormal findings: Secondary | ICD-10-CM | POA: Diagnosis not present

## 2023-08-27 DIAGNOSIS — Z8249 Family history of ischemic heart disease and other diseases of the circulatory system: Secondary | ICD-10-CM

## 2023-08-27 DIAGNOSIS — R718 Other abnormality of red blood cells: Secondary | ICD-10-CM

## 2023-08-27 DIAGNOSIS — D582 Other hemoglobinopathies: Secondary | ICD-10-CM

## 2023-08-27 DIAGNOSIS — Z23 Encounter for immunization: Secondary | ICD-10-CM

## 2023-08-27 DIAGNOSIS — M1611 Unilateral primary osteoarthritis, right hip: Secondary | ICD-10-CM

## 2023-08-27 DIAGNOSIS — E785 Hyperlipidemia, unspecified: Secondary | ICD-10-CM

## 2023-08-27 DIAGNOSIS — Z1211 Encounter for screening for malignant neoplasm of colon: Secondary | ICD-10-CM

## 2023-08-27 LAB — IBC + FERRITIN
Ferritin: 765.6 ng/mL — ABNORMAL HIGH (ref 10.0–291.0)
Iron: 94 ug/dL (ref 42–145)
Saturation Ratios: 33.6 % (ref 20.0–50.0)
TIBC: 280 ug/dL (ref 250.0–450.0)
Transferrin: 200 mg/dL — ABNORMAL LOW (ref 212.0–360.0)

## 2023-08-27 LAB — CBC
HCT: 48.1 % — ABNORMAL HIGH (ref 36.0–46.0)
Hemoglobin: 16.5 g/dL — ABNORMAL HIGH (ref 12.0–15.0)
MCHC: 34.3 g/dL (ref 30.0–36.0)
MCV: 95.8 fL (ref 78.0–100.0)
Platelets: 233 10*3/uL (ref 150.0–400.0)
RBC: 5.02 Mil/uL (ref 3.87–5.11)
RDW: 12.3 % (ref 11.5–15.5)
WBC: 5 10*3/uL (ref 4.0–10.5)

## 2023-08-27 LAB — COMPREHENSIVE METABOLIC PANEL
ALT: 78 U/L — ABNORMAL HIGH (ref 0–35)
AST: 45 U/L — ABNORMAL HIGH (ref 0–37)
Albumin: 4.7 g/dL (ref 3.5–5.2)
Alkaline Phosphatase: 25 U/L — ABNORMAL LOW (ref 39–117)
BUN: 17 mg/dL (ref 6–23)
CO2: 32 meq/L (ref 19–32)
Calcium: 9.5 mg/dL (ref 8.4–10.5)
Chloride: 100 meq/L (ref 96–112)
Creatinine, Ser: 1 mg/dL (ref 0.40–1.20)
GFR: 62.5 mL/min (ref >60.00)
Glucose, Bld: 83 mg/dL (ref 70–99)
Potassium: 4.5 meq/L (ref 3.5–5.1)
Sodium: 139 meq/L (ref 135–145)
Total Bilirubin: 1.1 mg/dL (ref 0.2–1.2)
Total Protein: 6.8 g/dL (ref 6.0–8.3)

## 2023-08-27 LAB — LIPID PANEL
Cholesterol: 210 mg/dL — ABNORMAL HIGH (ref 0–200)
HDL: 29.7 mg/dL — ABNORMAL LOW (ref >39.00)
LDL Cholesterol: 163 mg/dL — ABNORMAL HIGH (ref 0–99)
NonHDL: 179.87
Total CHOL/HDL Ratio: 7
Triglycerides: 82 mg/dL (ref 0.0–149.0)
VLDL: 16.4 mg/dL (ref 0.0–40.0)

## 2023-08-27 NOTE — Progress Notes (Signed)
 Subjective:    Patient ID: Tara Parker, female    DOB: [birth year], 58 y.o.   MRN: [id number]  HPI  Tara Parker is a very pleasant 58 y.o. female who presents today for complete physical and follow up of chronic conditions.  Immunizations: -Tetanus: Completed in 2018 -Influenza: Influenza vaccine provided today.  -Shingles: Completed Shingrix  series  Diet: Fair diet.  Exercise: Strength training and walking  Eye exam: Completes annually  Dental exam: Completes semi-annually    Pap Smear: May 2023 Mammogram: April 2024  Colonoscopy: Never completed   BP Readings from Last 3 Encounters:  08/27/23 102/60  08/26/23 100/64  07/10/23 122/68       Review of Systems  Constitutional:  Negative for unexpected weight change.  HENT:  Negative for rhinorrhea.   Respiratory:  Negative for cough and shortness of breath.   Cardiovascular:  Negative for chest pain.  Gastrointestinal:  Negative for constipation and diarrhea.  Genitourinary:  Negative for difficulty urinating.  Musculoskeletal:  Positive for arthralgias.  Skin:  Negative for rash.  Allergic/Immunologic: Negative for environmental allergies.  Neurological:  Negative for dizziness, numbness and headaches.  Psychiatric/Behavioral:  The patient is not nervous/anxious.          Past Medical History:  Diagnosis Date   Chicken pox    Postmenopausal vaginal bleeding    Shingles     Social History   Socioeconomic History   Marital status: Married    Spouse name: Not on file   Number of children: Not on file   Years of education: Not on file   Highest education level: Associate degree: occupational, scientist, product/process development, or vocational program  Occupational History   Not on file  Tobacco Use   Smoking status: Former    Types: Cigarettes   Smokeless tobacco: Never   Tobacco comments:    Teen years   Vaping Use   Vaping status: Never Used  Substance and Sexual Activity   Alcohol use: Yes    Comment: rare    Drug use: No   Sexual activity: Yes    Partners: Male    Birth control/protection: None    Comment: Married  Other Topics Concern   Not on file  Social History Narrative   Married.   1 child, age 60.   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   Enjoys reading, exercise, being outdoors.   Social Drivers of Corporate Investment Banker Strain: Low Risk  (07/10/2023)   Overall Financial Resource Strain (CARDIA)    Difficulty of Paying Living Expenses: Not very hard  Food Insecurity: No Food Insecurity (07/10/2023)   Hunger Vital Sign    Worried About Running Out of Food in the Last Year: Never true    Ran Out of Food in the Last Year: Never true  Transportation Needs: No Transportation Needs (07/10/2023)   PRAPARE - Administrator, Civil Service (Medical): No    Lack of Transportation (Non-Medical): No  Physical Activity: Sufficiently Active (07/10/2023)   Exercise Vital Sign    Days of Exercise per Week: 5 days    Minutes of Exercise per Session: 40 min  Stress: No Stress Concern Present (07/10/2023)   [employer] of Occupational Health - Occupational Stress Questionnaire    Feeling of Stress : Only a little  Social Connections: Socially Integrated (07/10/2023)   Social Connection and Isolation Panel [NHANES]    Frequency of Communication with  Friends and Family: Twice a week    Frequency of Social Gatherings with Friends and Family: Once a week    Attends Religious Services: More than 4 times per year    Active Member of [employer] or Organizations: Yes    Attends Banker Meetings: Never    Marital Status: Married  Catering Manager Violence: Not on file    Past Surgical History:  Procedure Laterality Date   BREAST ENHANCEMENT SURGERY     CHOLECYSTECTOMY      Family History  Problem Relation Age of Onset   Heart disease Mother    Heart attack Mother    Cancer Maternal Grandmother 27       Uterine     Allergies  Allergen Reactions   Penicillins     Current Outpatient Medications on File Prior to Visit  Medication Sig Dispense Refill   diclofenac  (VOLTAREN ) 75 MG EC tablet Take 1 tablet (75 mg total) by mouth 2 (two) times daily. 60 tablet 3   estradiol  (ESTRACE ) 0.5 MG tablet TAKE 1 TABLET BY MOUTH EVERY DAY 90 tablet 3   Multiple Vitamin (MULTIVITAMIN) capsule Take 1 capsule by mouth daily.     progesterone  (PROMETRIUM ) 100 MG capsule TAKE 1 CAPSULE (100 MG TOTAL) BY MOUTH DAILY. TAKE 14-25 EVERY MONTH 36 capsule 3   No current facility-administered medications on file prior to visit.    BP 102/60   Pulse 79   Temp 97.9 F (36.6 C) (Temporal)   Ht 5' 5.5 (1.664 m)   Wt 139 lb (63 kg)   LMP 12/03/2014   SpO2 98%   BMI 22.78 kg/m  Objective:   Physical Exam HENT:     Right Ear: Tympanic membrane and ear canal normal.     Left Ear: Tympanic membrane and ear canal normal.  Eyes:     Pupils: Pupils are equal, round, and reactive to light.  Cardiovascular:     Rate and Rhythm: Normal rate and regular rhythm.  Pulmonary:     Effort: Pulmonary effort is normal.     Breath sounds: Normal breath sounds.  Abdominal:     General: Bowel sounds are normal.     Palpations: Abdomen is soft.     Tenderness: There is no abdominal tenderness.  Musculoskeletal:        General: Normal range of motion.     Cervical back: Neck supple.  Skin:    General: Skin is warm and dry.  Neurological:     Mental Status: She is alert and oriented to person, place, and time.     Cranial Nerves: No cranial nerve deficit.     Deep Tendon Reflexes:     Reflex Scores:      Patellar reflexes are 2+ on the right side and 2+ on the left side. Psychiatric:        Mood and Affect: Mood normal.           Assessment & Plan:  Preventative health care Assessment & Plan: Immunizations UTD. Influenza vaccine provided today.  Pap smear UTD. Mammogram UTD Colonoscopy due, referral placed to  GI  Discussed the importance of a healthy diet and regular exercise in order for weight loss, and to reduce the risk of further co-morbidity.  Exam stable. Labs pending.  Follow up in 1 year for repeat physical.   Orders: -     Lipid panel -     CBC -     Comprehensive metabolic panel  Screening  for colon cancer -     Ambulatory referral to Gastroenterology  Anxiety Assessment & Plan: Controlled.  No concerns today. Continue to monitor.   Hormone replacement therapy Assessment & Plan: Stable.  Continue estradiol  0.5 mg daily, progesterone  100 mg daily. Following with GYN.   Encounter for immunization -     Flu vaccine trivalent PF, 6mos and older(Flulaval,Afluria,Fluarix,Fluzone)  Primary localized osteoarthritis of right hip Assessment & Plan: Following with sports medicine.  MRI pending. Continue diclofenac  75 mg twice daily as needed.         Devona Holmes K Adan Beal, NP

## 2023-08-27 NOTE — Assessment & Plan Note (Signed)
 Following with sports medicine.  MRI pending. Continue diclofenac  75 mg twice daily as needed.

## 2023-08-27 NOTE — Assessment & Plan Note (Signed)
 Controlled.  No concerns today. Continue to monitor.

## 2023-08-27 NOTE — Assessment & Plan Note (Signed)
 Stable.  Continue estradiol  0.5 mg daily, progesterone  100 mg daily. Following with GYN.

## 2023-08-27 NOTE — Assessment & Plan Note (Signed)
Immunizations UTD. Influenza vaccine provided today. Pap smear UTD. Mammogram UTD. Colonoscopy due, referral placed to GI.  Discussed the importance of a healthy diet and regular exercise in order for weight loss, and to reduce the risk of further co-morbidity.  Exam stable. Labs pending.  Follow up in 1 year for repeat physical.

## 2023-08-31 ENCOUNTER — Telehealth: Payer: Self-pay | Admitting: Primary Care

## 2023-08-31 IMAGING — XA DG FLUORO GUIDE NDL PLC/BX
1 series · 1 of 1 positions shown · non-contrast
Comparison: Radiography 08/05/2021

CLINICAL DATA: Right hip and thigh discomfort.

EXAM:
Fluoroscopically guided right hip injection
TECHNIQUE: The overlying skin was prepped with Betadine, draped in the usual
sterile fashion, and infiltrated locally with 1% Lidocaine. A 22
gauge spinal needle was advanced under fluoroscopic observation to
the lateral femoral neck. Confirmatory injection of less than 1 ml
of Asovue-CVV demonstrates intra-articular spread without
intravascular component.
Eighty mg Depo-Medrol and 2 cc 0.25% bupivacaine were then
instilled. The procedure was well tolerated. The patient was
observed for a short period of time then discharged in good
condition.
FLUOROSCOPY TIME:  0 minutes 18 seconds. 15.64 micro gray meter
squared

[Series 1: ortho standard · 1 of 1 slices shown]
[im 1/1]
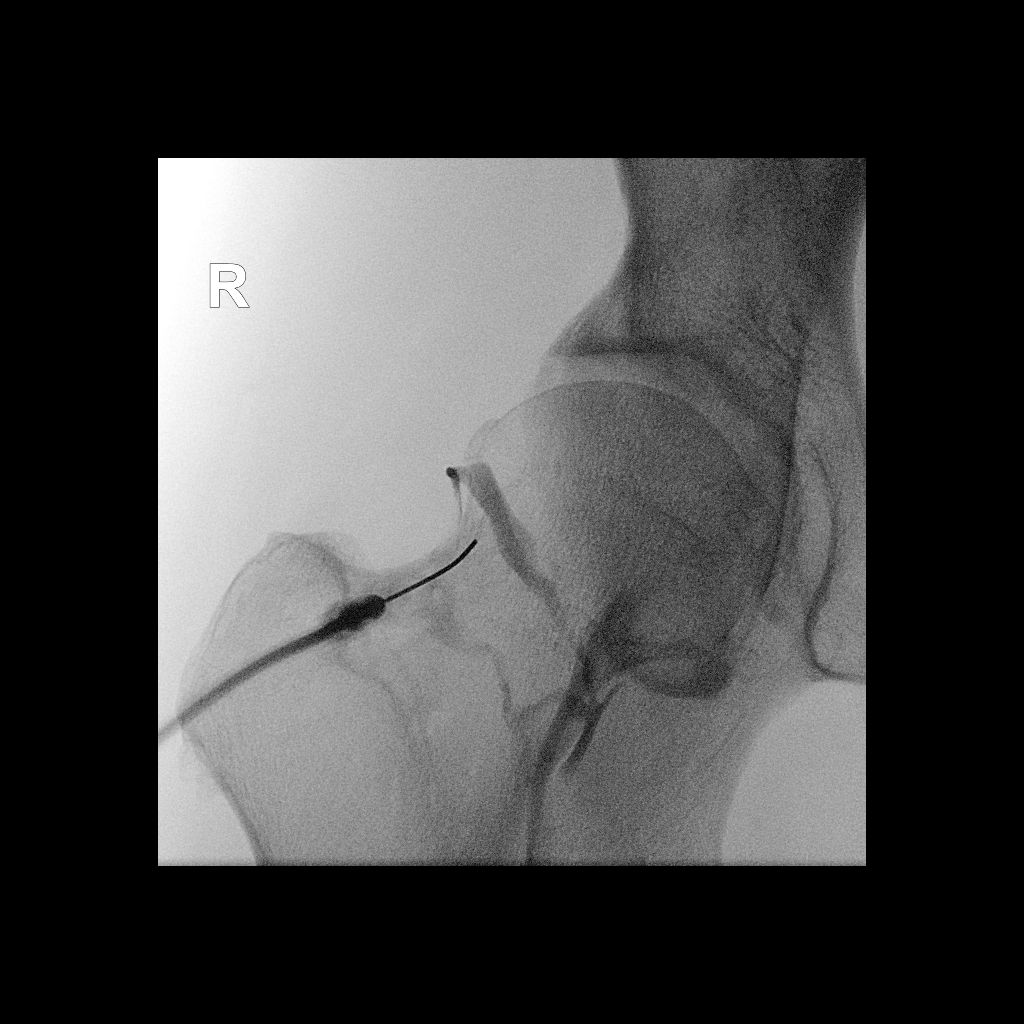

[1 of 1 positions shown; findings below may reference images not displayed]

IMPRESSION: Technically successful right hip injection under fluoroscopy.

## 2023-08-31 NOTE — Telephone Encounter (Signed)
 Patient dropped off document  Physical Exam Form , to be filled out by provider. Patient requested to send it back via Call Patient to pick up within 2-days. Document is located in providers tray at front office.Please advise at Mobile [phone] (mobile)

## 2023-09-01 ENCOUNTER — Encounter: Payer: Self-pay | Admitting: *Deleted

## 2023-09-01 NOTE — Telephone Encounter (Signed)
Patient picked up from at front desk.

## 2023-09-01 NOTE — Telephone Encounter (Signed)
Advised patient form is placed up front with reception.

## 2023-09-01 NOTE — Telephone Encounter (Signed)
Completed and placed in Kelli's inbox.

## 2023-09-03 ENCOUNTER — Encounter: Payer: Self-pay | Admitting: Family Medicine

## 2023-09-03 DIAGNOSIS — D225 Melanocytic nevi of trunk: Secondary | ICD-10-CM | POA: Diagnosis not present

## 2023-09-03 DIAGNOSIS — D2262 Melanocytic nevi of left upper limb, including shoulder: Secondary | ICD-10-CM | POA: Diagnosis not present

## 2023-09-03 DIAGNOSIS — C4441 Basal cell carcinoma of skin of scalp and neck: Secondary | ICD-10-CM | POA: Diagnosis not present

## 2023-09-03 DIAGNOSIS — D2272 Melanocytic nevi of left lower limb, including hip: Secondary | ICD-10-CM | POA: Diagnosis not present

## 2023-09-03 DIAGNOSIS — D485 Neoplasm of uncertain behavior of skin: Secondary | ICD-10-CM | POA: Diagnosis not present

## 2023-09-03 DIAGNOSIS — D2261 Melanocytic nevi of right upper limb, including shoulder: Secondary | ICD-10-CM | POA: Diagnosis not present

## 2023-09-07 ENCOUNTER — Encounter: Payer: Self-pay | Admitting: *Deleted

## 2023-09-11 ENCOUNTER — Ambulatory Visit
Admission: RE | Admit: 2023-09-11 | Discharge: 2023-09-11 | Disposition: A | Discharge status: Home / Self Care | Payer: Self-pay | Source: Ambulatory Visit | Attending: Primary Care | Admitting: Primary Care

## 2023-09-11 DIAGNOSIS — Z8249 Family history of ischemic heart disease and other diseases of the circulatory system: Secondary | ICD-10-CM | POA: Insufficient documentation

## 2023-09-11 DIAGNOSIS — E785 Hyperlipidemia, unspecified: Secondary | ICD-10-CM | POA: Insufficient documentation

## 2023-09-21 ENCOUNTER — Ambulatory Visit
Admission: RE | Admit: 2023-09-21 | Discharge: 2023-09-21 | Disposition: A | Discharge status: Home / Self Care | Payer: BLUE CROSS/BLUE SHIELD | Source: Ambulatory Visit | Attending: Family Medicine | Admitting: Family Medicine

## 2023-09-21 DIAGNOSIS — M1611 Unilateral primary osteoarthritis, right hip: Secondary | ICD-10-CM | POA: Diagnosis not present

## 2023-09-21 DIAGNOSIS — G8929 Other chronic pain: Secondary | ICD-10-CM

## 2023-09-21 DIAGNOSIS — M25551 Pain in right hip: Secondary | ICD-10-CM | POA: Diagnosis not present

## 2023-09-21 DIAGNOSIS — S73191A Other sprain of right hip, initial encounter: Secondary | ICD-10-CM | POA: Diagnosis not present

## 2023-09-21 MED ORDER — IOPAMIDOL (ISOVUE-M 200) INJECTION 41%
10.0000 mL | Freq: Once | INTRAMUSCULAR | Status: AC
  Administered 2023-09-21: 10 mL via INTRA_ARTICULAR

## 2023-09-25 ENCOUNTER — Encounter: Payer: Self-pay | Admitting: Family Medicine

## 2023-09-25 DIAGNOSIS — M1611 Unilateral primary osteoarthritis, right hip: Secondary | ICD-10-CM

## 2023-09-30 MED ORDER — CELECOXIB 200 MG PO CAPS: 200.0000 mg | ORAL_CAPSULE | Freq: Every day | ORAL | 2 refills | Status: DC

## 2023-09-30 NOTE — Addendum Note (Signed)
 Addended by: Hannah Beat on: 09/30/2023 02:13 PM   Modules accepted: Orders

## 2023-10-01 ENCOUNTER — Telehealth: Payer: Self-pay

## 2023-10-01 NOTE — Telephone Encounter (Signed)
 Message left for patient to return my call.

## 2023-10-01 NOTE — Telephone Encounter (Signed)
 Patient is calling to schedule her colonoscopy. She will be available all day except from 11:00am to 1:00pm.

## 2023-10-05 ENCOUNTER — Other Ambulatory Visit: Payer: Self-pay | Admitting: *Deleted

## 2023-10-05 ENCOUNTER — Telehealth: Payer: Self-pay | Admitting: *Deleted

## 2023-10-05 DIAGNOSIS — Z1211 Encounter for screening for malignant neoplasm of colon: Secondary | ICD-10-CM

## 2023-10-05 MED ORDER — NA SULFATE-K SULFATE-MG SULF 17.5-3.13-1.6 GM/177ML PO SOLN: 1.0000 | Freq: Once | ORAL | 0 refills | Status: AC

## 2023-10-05 NOTE — Telephone Encounter (Signed)
 Gastroenterology Pre-Procedure Review  Request Date: 11/02/2023 Requesting Physician: Dr. Servando Snare  PATIENT REVIEW QUESTIONS: The patient responded to the following health history questions as indicated:    1. Are you having any GI issues? no 2. Do you have a personal history of Polyps? no 3. Do you have a family history of Colon Cancer or Polyps? no 4. Diabetes Mellitus? no 5. Joint replacements in the past 12 months?no 6. Major health problems in the past 3 months?no 7. Any artificial heart valves, MVP, or defibrillator?no    MEDICATIONS & ALLERGIES:    Patient reports the following regarding taking any anticoagulation/antiplatelet therapy:   Plavix, Coumadin, Eliquis, Xarelto, Lovenox, Pradaxa, Brilinta, or Effient? no Aspirin? no  Patient confirms/reports the following medications:  Current Outpatient Medications  Medication Sig Dispense Refill   celecoxib (CELEBREX) 200 MG capsule Take 1 capsule (200 mg total) by mouth daily. 30 capsule 2   estradiol (ESTRACE) 0.5 MG tablet TAKE 1 TABLET BY MOUTH EVERY DAY 90 tablet 3   Multiple Vitamin (MULTIVITAMIN) capsule Take 1 capsule by mouth daily.     progesterone (PROMETRIUM) 100 MG capsule TAKE 1 CAPSULE (100 MG TOTAL) BY MOUTH DAILY. TAKE 14-25 EVERY MONTH 36 capsule 3   No current facility-administered medications for this visit.    Patient confirms/reports the following allergies:  Allergies  Allergen Reactions   Penicillins     No orders of the defined types were placed in this encounter.   AUTHORIZATION INFORMATION Primary Insurance: 1D#: Group #:  Secondary Insurance: 1D#: Group #:  SCHEDULE INFORMATION: Date: 11/02/2023 Time: Location:  [facility]

## 2023-10-05 NOTE — Telephone Encounter (Signed)
 Message left for patient to return my call.

## 2023-10-05 NOTE — Telephone Encounter (Signed)
 Patient called back. Colonoscopy schedule with Dr Servando Snare on 11/02/2023

## 2023-10-13 MED ORDER — DICLOFENAC SODIUM 75 MG PO TBEC: 75.0000 mg | DELAYED_RELEASE_TABLET | Freq: Two times a day (BID) | ORAL | 3 refills | Status: DC

## 2023-10-13 NOTE — Telephone Encounter (Signed)
 Tara Parker,   Can you escalate this case and see why the referral coordinators have not made this consult appointment or contacted the patient two weeks from the date of the consult order?

## 2023-10-26 ENCOUNTER — Telehealth: Payer: Self-pay

## 2023-10-26 NOTE — Telephone Encounter (Signed)
Error open

## 2023-10-26 NOTE — Telephone Encounter (Signed)
 Spoken to patient, she was concern about taking celebrex which is an nonsteroidal anti-inflammatory agents (NSAIDs). Inform patient that for a procedure, she would have stop celebrex for 5 days before colonoscopy. Patient stated that she is not sure she can do that. Inform patient, she can take acetaminophen but she stated that does not work for her.  I did let patient know if she decide not to go through with it since she have to stop celebrex to call me and let me know.

## 2023-10-26 NOTE — Telephone Encounter (Signed)
 Patient left a message on the main line and states she has some questions about her upcoming colonoscopy

## 2023-11-02 ENCOUNTER — Other Ambulatory Visit: Payer: Self-pay

## 2023-11-02 ENCOUNTER — Ambulatory Visit: Admitting: Certified Registered Nurse Anesthetist

## 2023-11-02 ENCOUNTER — Encounter: Payer: Self-pay | Admitting: Gastroenterology

## 2023-11-02 ENCOUNTER — Encounter
Admission: RE | Disposition: A | Discharge status: Home / Self Care | Payer: Self-pay | Source: Home / Self Care | Attending: Gastroenterology

## 2023-11-02 ENCOUNTER — Ambulatory Visit
Admission: RE | Admit: 2023-11-02 | Discharge: 2023-11-02 | Disposition: A | Discharge status: Home / Self Care | Attending: Gastroenterology | Admitting: Gastroenterology

## 2023-11-02 DIAGNOSIS — Z87891 Personal history of nicotine dependence: Secondary | ICD-10-CM | POA: Diagnosis not present

## 2023-11-02 DIAGNOSIS — Z1211 Encounter for screening for malignant neoplasm of colon: Secondary | ICD-10-CM

## 2023-11-02 HISTORY — PX: COLONOSCOPY: SHX5424

## 2023-11-02 SURGERY — COLONOSCOPY
Anesthesia: General

## 2023-11-02 MED ORDER — SODIUM CHLORIDE 0.9 % IV SOLN: INTRAVENOUS | Status: DC

## 2023-11-02 MED ORDER — PROPOFOL 10 MG/ML IV BOLUS
INTRAVENOUS | Status: DC | PRN
  Administered 2023-11-02: 125 ug/kg/min via INTRAVENOUS
  Administered 2023-11-02: 100 mg via INTRAVENOUS

## 2023-11-02 MED ORDER — PROPOFOL 1000 MG/100ML IV EMUL
INTRAVENOUS | Status: AC
  Filled 2023-11-02: qty 100

## 2023-11-02 MED ORDER — LIDOCAINE HCL (CARDIAC) PF 100 MG/5ML IV SOSY
PREFILLED_SYRINGE | INTRAVENOUS | Status: DC | PRN
  Administered 2023-11-02: 50 mg via INTRAVENOUS

## 2023-11-02 NOTE — Anesthesia Preprocedure Evaluation (Signed)
 Anesthesia Evaluation  Patient identified by MRN, date of birth, ID band Patient awake    Reviewed: Allergy & Precautions, NPO status , Patient's Chart, lab work & pertinent test results  Airway Mallampati: III  TM Distance: >3 FB Neck ROM: full    Dental  (+) Chipped   Pulmonary neg pulmonary ROS, former smoker   Pulmonary exam normal        Cardiovascular negative cardio ROS Normal cardiovascular exam     Neuro/Psych negative neurological ROS  negative psych ROS   GI/Hepatic negative GI ROS, Neg liver ROS,,,  Endo/Other  negative endocrine ROS    Renal/GU negative Renal ROS  negative genitourinary   Musculoskeletal   Abdominal   Peds  Hematology negative hematology ROS (+)   Anesthesia Other Findings Past Medical History: No date: Chicken pox No date: Postmenopausal vaginal bleeding No date: Shingles  Past Surgical History: No date: BREAST ENHANCEMENT SURGERY No date: CHOLECYSTECTOMY  BMI    Body Mass Index: 22.13 kg/m      Reproductive/Obstetrics negative OB ROS                             Anesthesia Physical Anesthesia Plan  ASA: 2  Anesthesia Plan: General   Post-op Pain Management: Minimal or no pain anticipated   Induction: Intravenous  PONV Risk Score and Plan: 3 and Propofol infusion, TIVA and Ondansetron  Airway Management Planned: Nasal Cannula  Additional Equipment: None  Intra-op Plan:   Post-operative Plan:   Informed Consent: I have reviewed the patients History and Physical, chart, labs and discussed the procedure including the risks, benefits and alternatives for the proposed anesthesia with the patient or authorized representative who has indicated his/her understanding and acceptance.     Dental advisory given  Plan Discussed with: CRNA and Surgeon  Anesthesia Plan Comments: (Discussed risks of anesthesia with patient, including possibility  of difficulty with spontaneous ventilation under anesthesia necessitating airway intervention, PONV, and rare risks such as cardiac or respiratory or neurological events, and allergic reactions. Discussed the role of CRNA in patient's perioperative care. Patient understands.)       Anesthesia Quick Evaluation

## 2023-11-02 NOTE — Anesthesia Postprocedure Evaluation (Signed)
 Anesthesia Post Note  Patient: Tara Parker  Procedure(s) Performed: COLONOSCOPY  Patient location during evaluation: Endoscopy Anesthesia Type: General Level of consciousness: awake and alert Pain management: pain level controlled Vital Signs Assessment: post-procedure vital signs reviewed and stable Respiratory status: spontaneous breathing, nonlabored ventilation, respiratory function stable and patient connected to nasal cannula oxygen Cardiovascular status: blood pressure returned to baseline and stable Postop Assessment: no apparent nausea or vomiting Anesthetic complications: no  No notable events documented.   Last Vitals:  Vitals:   11/02/23 0926 11/02/23 0929  BP:  (!) 81/50  Pulse:  63  Resp: (!) 23 13  Temp: (!) 35.8 C   SpO2:  99%    Last Pain:  Vitals:   11/02/23 0926  TempSrc: Temporal  PainSc: 0-No pain                 Enrique Harvest

## 2023-11-02 NOTE — Transfer of Care (Signed)
 Immediate Anesthesia Transfer of Care Note  Patient: Tara Parker  Procedure(s) Performed: COLONOSCOPY  Patient Location: Endoscopy Unit  Anesthesia Type:General  Level of Consciousness: awake, alert , oriented, and drowsy  Airway & Oxygen Therapy: Patient Spontanous Breathing  Post-op Assessment: Report given to RN and Post -op Vital signs reviewed and stable  Post vital signs: Reviewed and stable  Last Vitals:  Vitals Value Taken Time  BP 81/50 11/02/23 0929  Temp 35.8 C 11/02/23 0926  Pulse 63 11/02/23 0929  Resp 13 11/02/23 0929  SpO2 99 % 11/02/23 0929    Last Pain:  Vitals:   11/02/23 0926  TempSrc: Temporal  PainSc: 0-No pain         Complications: No notable events documented.

## 2023-11-02 NOTE — H&P (Signed)
 Midge Minium, MD [facility] [address], [address] [place], [place] [address] Phone: [phone] Fax : [phone]  Primary Care Physician:  Doreene Nest, NP Primary Gastroenterologist:  Dr. Servando Snare  Pre-Procedure History & Physical: HPI:  Tara Parker is a 58 y.o. female is here for a screening colonoscopy.   Past Medical History:  Diagnosis Date   Chicken pox    Postmenopausal vaginal bleeding    Shingles     Past Surgical History:  Procedure Laterality Date   BREAST ENHANCEMENT SURGERY     CHOLECYSTECTOMY      Prior to Admission medications   Medication Sig Start Date End Date Taking? Authorizing Provider  celecoxib (CELEBREX) 200 MG capsule Take 200 mg by mouth daily.   Yes [provider]  estradiol (ESTRACE) 0.5 MG tablet TAKE 1 TABLET BY MOUTH EVERY DAY 12/01/22  Yes Reva Bores, MD  progesterone (PROMETRIUM) 100 MG capsule TAKE 1 CAPSULE (100 MG TOTAL) BY MOUTH DAILY. TAKE 14-25 EVERY MONTH 12/01/22  Yes Reva Bores, MD  diclofenac (VOLTAREN) 75 MG EC tablet Take 1 tablet (75 mg total) by mouth 2 (two) times daily. Patient not taking: Reported on 10/27/2023 10/13/23   Hannah Beat, MD  Multiple Vitamin (MULTIVITAMIN) capsule Take 1 capsule by mouth daily.    [provider]    Allergies as of 10/05/2023 - Review Complete 08/27/2023  Allergen Reaction Noted   Penicillins  12/28/2014    Family History  Problem Relation Age of Onset   Heart disease Mother    Heart attack Mother    Cancer Maternal Grandmother 39       Uterine    Social History   Socioeconomic History   Marital status: Married    Spouse name: Not on file   Number of children: Not on file   Years of education: Not on file   Highest education level: Associate degree: occupational, Scientist, product/process development, or vocational program  Occupational History   Not on file  Tobacco Use   Smoking status: Former    Types: Cigarettes   Smokeless tobacco: Never   Tobacco comments:    Teen  years   Vaping Use   Vaping status: Never Used  Substance and Sexual Activity   Alcohol use: Yes    Comment: occasionally   Drug use: No   Sexual activity: Yes    Partners: Male    Birth control/protection: None    Comment: Married  Other Topics Concern   Not on file  Social History Narrative   Married.   1 child, age 22.   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   Enjoys reading, exercise, being outdoors.   Social Drivers of Corporate investment banker Strain: Low Risk  (07/10/2023)   Overall Financial Resource Strain (CARDIA)    Difficulty of Paying Living Expenses: Not very hard  Food Insecurity: No Food Insecurity (07/10/2023)   Hunger Vital Sign    Worried About Running Out of Food in the Last Year: Never true    Ran Out of Food in the Last Year: Never true  Transportation Needs: No Transportation Needs (07/10/2023)   PRAPARE - Administrator, Civil Service (Medical): No    Lack of Transportation (Non-Medical): No  Physical Activity: Sufficiently Active (07/10/2023)   Exercise Vital Sign    Days of Exercise per Week: 5 days    Minutes of Exercise per Session: 40 min  Stress: No Stress Concern Present (07/10/2023)   [employer] of Occupational Health - Occupational Stress Questionnaire    Feeling of Stress : Only a little  Social Connections: Socially Integrated (07/10/2023)   Social Connection and Isolation Panel [NHANES]    Frequency of Communication with Friends and Family: Twice a week    Frequency of Social Gatherings with Friends and Family: Once a week    Attends Religious Services: More than 4 times per year    Active Member of [employer] or Organizations: Yes    Attends Banker Meetings: Never    Marital Status: Married  Catering manager Violence: Not on file    Review of Systems: See HPI, otherwise negative ROS  Physical Exam: BP 94/63   Pulse 62   Temp (!) 97 F (36.1 C)  (Temporal)   Resp 20   Ht 5\' 5"  (1.651 m)   Wt 60.3 kg   LMP 12/03/2014   SpO2 100%   BMI 22.13 kg/m  General:   Alert,  pleasant and cooperative in NAD Head:  Normocephalic and atraumatic. Neck:  Supple; no masses or thyromegaly. Lungs:  Clear throughout to auscultation.    Heart:  Regular rate and rhythm. Abdomen:  Soft, nontender and nondistended. Normal bowel sounds, without guarding, and without rebound.   Neurologic:  Alert and  oriented x4;  grossly normal neurologically.  Impression/Plan: Tara Parker is now here to undergo a screening colonoscopy.  Risks, benefits, and alternatives regarding colonoscopy have been reviewed with the patient.  Questions have been answered.  All parties agreeable.

## 2023-11-02 NOTE — Anesthesia Procedure Notes (Signed)
 Date/Time: 11/02/2023 9:03 AM  Performed by: Angelia Kelp, CRNAPre-anesthesia Checklist: Patient identified, Emergency Drugs available, Suction available, Patient being monitored and Timeout performed Patient Re-evaluated:Patient Re-evaluated prior to induction Oxygen Delivery Method: Nasal cannula Preoxygenation: Pre-oxygenation with 100% oxygen Induction Type: IV induction

## 2023-11-02 NOTE — Op Note (Signed)
 [facility] Patient Name: Tara Parker Procedure Date: 11/02/2023 9:02 AM MRN: [id number] Account #: [id number] Date of Birth: 09-24-1965 Admit Type: Outpatient Age: 58 Room: [facility] ENDO ROOM 4 Gender: Female Note Status: Finalized Instrument Name: Prentice Docker [id number] Procedure:             Colonoscopy Indications:           Screening for colorectal malignant neoplasm Providers:             Midge Minium MD, MD Referring MD:          Doreene Nest (Referring MD) Medicines:             Propofol per Anesthesia Complications:         No immediate complications. Procedure:             Pre-Anesthesia Assessment:                        - Prior to the procedure, a History and Physical was                         performed, and patient medications and allergies were                         reviewed. The patient's tolerance of previous                         anesthesia was also reviewed. The risks and benefits                         of the procedure and the sedation options and risks                         were discussed with the patient. All questions were                         answered, and informed consent was obtained. Prior                         Anticoagulants: The patient has taken no anticoagulant                         or antiplatelet agents. ASA Grade Assessment: II - A                         patient with mild systemic disease. After reviewing                         the risks and benefits, the patient was deemed in                         satisfactory condition to undergo the procedure.                        After obtaining informed consent, the colonoscope was                         passed under direct vision. Throughout the procedure,  the patient's blood pressure, pulse, and oxygen                         saturations were monitored continuously. The                         Colonoscope was introduced through  the anus and                         advanced to the the cecum, identified by appendiceal                         orifice and ileocecal valve. The colonoscopy was                         performed without difficulty. The patient tolerated                         the procedure well. The quality of the bowel                         preparation was excellent. Findings:      The perianal and digital rectal examinations were normal.      The entire examined colon appeared normal. Impression:            - The entire examined colon is normal.                        - No specimens collected. Recommendation:        - Discharge patient to home.                        - Resume previous diet.                        - Continue present medications.                        - Repeat colonoscopy in 10 years for screening                         purposes. Procedure Code(s):     --- Professional ---                        [phone], Colonoscopy, flexible; diagnostic, including                         collection of specimen(s) by brushing or washing, when                         performed (separate procedure) Diagnosis Code(s):     --- Professional ---                        Z12.11, Encounter for screening for malignant neoplasm                         of colon CPT copyright 2022 [employer]. All rights reserved. The codes documented in this report are preliminary and upon coder review may  be revised  to meet current compliance requirements. Marnee Sink MD, MD 11/02/2023 9:24:33 AM This report has been signed electronically. Number of Addenda: 0 Note Initiated On: 11/02/2023 9:02 AM Scope Withdrawal Time: 0 hours 8 minutes 1 second  Total Procedure Duration: 0 hours 14 minutes 36 seconds  Estimated Blood Loss:  Estimated blood loss: none.      [facility]

## 2023-11-03 ENCOUNTER — Encounter: Payer: Self-pay | Admitting: Gastroenterology

## 2023-11-22 NOTE — Progress Notes (Unsigned)
     Tara Parker T. Tara Buckles, MD, CAQ Sports Medicine [facility] [address] [place], [address]  Phone: [phone]  FAX: [phone]  Tara Parker - 58 y.o. female  MRN [id number]  Date of Birth: 06-03-1966  Date: 11/23/2023  PCP: Tara John, NP  Referral: Tara John, NP  No chief complaint on file.  Subjective:   Tara Parker is a 58 y.o. very pleasant female patient with There is no height or weight on file to calculate BMI. who presents with the following:  Tara Parker is a very nice lady who presents with some ongoing knee pain.  I have seen her a number of times over the years.    Review of Systems is noted in the HPI, as appropriate  Objective:   LMP 12/03/2014   GEN: No acute distress; alert,appropriate. PULM: Breathing comfortably in no respiratory distress PSYCH: Normally interactive.   Laboratory and Imaging Data:  Assessment and Plan:   ***

## 2023-11-23 ENCOUNTER — Ambulatory Visit (INDEPENDENT_AMBULATORY_CARE_PROVIDER_SITE_OTHER): Payer: Self-pay | Admitting: Family Medicine

## 2023-11-23 ENCOUNTER — Encounter: Payer: Self-pay | Admitting: Family Medicine

## 2023-11-23 VITALS — BP 118/66 | HR 90 | Temp 97.5°F | Ht 65.5 in | Wt 135.0 lb

## 2023-11-23 DIAGNOSIS — M25562 Pain in left knee: Secondary | ICD-10-CM

## 2023-12-07 DIAGNOSIS — Z1231 Encounter for screening mammogram for malignant neoplasm of breast: Secondary | ICD-10-CM

## 2023-12-12 ENCOUNTER — Other Ambulatory Visit: Payer: Self-pay | Admitting: Family Medicine

## 2023-12-12 DIAGNOSIS — Z79890 Hormone replacement therapy (postmenopausal): Secondary | ICD-10-CM

## 2023-12-21 ENCOUNTER — Telehealth: Payer: Self-pay

## 2023-12-21 MED ORDER — PROGESTERONE MICRONIZED 100 MG PO CAPS: 100.0000 mg | ORAL_CAPSULE | Freq: Every day | ORAL | 1 refills | Status: DC

## 2023-12-21 NOTE — Telephone Encounter (Signed)
 Left message for pt to call office back regarding Annual appointment

## 2023-12-24 ENCOUNTER — Other Ambulatory Visit: Payer: Self-pay | Admitting: Family Medicine

## 2023-12-24 DIAGNOSIS — M1611 Unilateral primary osteoarthritis, right hip: Secondary | ICD-10-CM

## 2024-01-06 ENCOUNTER — Inpatient Hospital Stay
Admission: RE | Admit: 2024-01-06 | Discharge: 2024-01-06 | Disposition: A | Discharge status: Home / Self Care | Payer: Self-pay | Source: Ambulatory Visit | Attending: Primary Care

## 2024-01-06 ENCOUNTER — Inpatient Hospital Stay
Admission: RE | Admit: 2024-01-06 | Discharge: 2024-01-06 | Disposition: A | Discharge status: Home / Self Care | Payer: Self-pay | Source: Ambulatory Visit | Attending: Primary Care | Admitting: Primary Care

## 2024-01-06 ENCOUNTER — Other Ambulatory Visit: Payer: Self-pay | Admitting: *Deleted

## 2024-01-06 DIAGNOSIS — Z1231 Encounter for screening mammogram for malignant neoplasm of breast: Secondary | ICD-10-CM

## 2024-01-20 ENCOUNTER — Telehealth: Payer: Self-pay | Admitting: Primary Care

## 2024-01-20 DIAGNOSIS — Z01818 Encounter for other preprocedural examination: Secondary | ICD-10-CM

## 2024-01-20 NOTE — Telephone Encounter (Signed)
 No answer no voice mail

## 2024-01-20 NOTE — Telephone Encounter (Signed)
 Please call patient:  I received a surgical form for her upcoming right hip replacement.  Since we just saw her in February 2025 I do not believe that she needs to come in for reevaluation only if:  Does she have any chest pain? Any shortness of breath?  If the answer is no then no appointment is needed with me, but she will need a lab appointment for their required labs.

## 2024-01-21 NOTE — Telephone Encounter (Unsigned)
 Copied from CRM [id number]. Topic: General - Call Back - No Documentation >> Jan 21, 2024  9:58 AM Rea C wrote: Reason for CRM: Patient returned phone call in regards to message left about surgical form for upcoming hip replacement.  Patient stated that she is not experiencing any chest pain or shortness of breath. She also stated that Dr. Watt was assisting with her hip replacement surgery and forms. Patient is currently out of town and stated that when she returns next week, she will schedule her labs.

## 2024-01-21 NOTE — Telephone Encounter (Signed)
 Unable to reach patient. Left voicemail to return call to our office.

## 2024-01-21 NOTE — Telephone Encounter (Signed)
 Noted

## 2024-01-25 ENCOUNTER — Encounter: Payer: Self-pay | Admitting: Family Medicine

## 2024-01-25 NOTE — Telephone Encounter (Signed)
 Can you check on this?  I cannot remember - she may have asked me to change and be her PCP?  Typically, PCP will do surgical clearance.  Apologies if I do not recall, but it is not changed in the computer.

## 2024-01-26 NOTE — Telephone Encounter (Signed)
 Hey guys, yes, please see phone note dated 01/20/24. Thanks.

## 2024-02-01 ENCOUNTER — Other Ambulatory Visit (INDEPENDENT_AMBULATORY_CARE_PROVIDER_SITE_OTHER)

## 2024-02-01 DIAGNOSIS — Z01818 Encounter for other preprocedural examination: Secondary | ICD-10-CM

## 2024-02-01 LAB — BASIC METABOLIC PANEL WITH GFR
BUN: 20 mg/dL (ref 6–23)
CO2: 32 meq/L (ref 19–32)
Calcium: 9.3 mg/dL (ref 8.4–10.5)
Chloride: 103 meq/L (ref 96–112)
Creatinine, Ser: 0.95 mg/dL (ref 0.40–1.20)
GFR: 66.26 mL/min (ref >60.00)
Glucose, Bld: 68 mg/dL — ABNORMAL LOW (ref 70–99)
Potassium: 4 meq/L (ref 3.5–5.1)
Sodium: 141 meq/L (ref 135–145)

## 2024-02-01 LAB — CBC
HCT: 41.9 % (ref 36.0–46.0)
Hemoglobin: 14.8 g/dL (ref 12.0–15.0)
MCHC: 35.4 g/dL (ref 30.0–36.0)
MCV: 91.8 fl (ref 78.0–100.0)
Platelets: 174 K/uL (ref 150.0–400.0)
RBC: 4.57 Mil/uL (ref 3.87–5.11)
RDW: 11.5 % (ref 11.5–15.5)
WBC: 3.4 K/uL — ABNORMAL LOW (ref 4.0–10.5)

## 2024-02-02 ENCOUNTER — Other Ambulatory Visit: Payer: Self-pay | Admitting: Primary Care

## 2024-02-02 ENCOUNTER — Ambulatory Visit: Payer: Self-pay | Admitting: Primary Care

## 2024-02-02 DIAGNOSIS — R7989 Other specified abnormal findings of blood chemistry: Secondary | ICD-10-CM

## 2024-02-03 ENCOUNTER — Other Ambulatory Visit (INDEPENDENT_AMBULATORY_CARE_PROVIDER_SITE_OTHER)

## 2024-02-03 ENCOUNTER — Other Ambulatory Visit: Payer: Self-pay | Admitting: Primary Care

## 2024-02-03 DIAGNOSIS — R7989 Other specified abnormal findings of blood chemistry: Secondary | ICD-10-CM

## 2024-02-03 LAB — CBC WITH DIFFERENTIAL/PLATELET
Absolute Lymphocytes: 959 {cells}/uL (ref 850–3900)
Absolute Monocytes: 447 {cells}/uL (ref 200–950)
Basophils Absolute: 29 {cells}/uL (ref 0–200)
Basophils Relative: 0.7 %
Eosinophils Absolute: 70 {cells}/uL (ref 15–500)
Eosinophils Relative: 1.7 %
HCT: 44.4 % (ref 35.0–45.0)
Hemoglobin: 15.2 g/dL (ref 11.7–15.5)
MCH: 33 pg (ref 27.0–33.0)
MCHC: 34.2 g/dL (ref 32.0–36.0)
MCV: 96.3 fL (ref 80.0–100.0)
MPV: 10.2 fL (ref 7.5–12.5)
Monocytes Relative: 10.9 %
Neutro Abs: 2595 {cells}/uL (ref 1500–7800)
Neutrophils Relative %: 63.3 %
Platelets: 177 Thousand/uL (ref 140–400)
RBC: 4.61 Million/uL (ref 3.80–5.10)
RDW: 11.1 % (ref 11.0–15.0)
Total Lymphocyte: 23.4 %
WBC: 4.1 Thousand/uL (ref 3.8–10.8)

## 2024-02-04 ENCOUNTER — Telehealth: Payer: Self-pay

## 2024-02-04 ENCOUNTER — Ambulatory Visit: Payer: Self-pay | Admitting: Primary Care

## 2024-02-04 NOTE — Telephone Encounter (Signed)
 Left message to return call to office. Need to follow up regarding lab redraw from 7.16.25

## 2024-02-04 NOTE — Telephone Encounter (Signed)
 Left voicemail for patient to return my call.  I need to speak with patient regarding her labs.

## 2024-02-04 NOTE — Telephone Encounter (Signed)
 Spoke with patient, advised her per Mallie she does not need to come back in for two labs that were not drawn yesterday. Advised Mallie will comment via MyChart on results once she reviews them and we will fax off the surgical clearance form.

## 2024-02-08 LAB — PERIPHERAL BLOOD SMEAR REVIEW

## 2024-02-08 LAB — EXTRA LAV TOP TUBE

## 2024-02-11 ENCOUNTER — Ambulatory Visit
Admission: RE | Admit: 2024-02-11 | Discharge: 2024-02-11 | Disposition: A | Discharge status: Home / Self Care | Payer: Self-pay | Source: Ambulatory Visit | Attending: Primary Care | Admitting: Primary Care

## 2024-02-11 ENCOUNTER — Other Ambulatory Visit: Payer: Self-pay | Admitting: Primary Care

## 2024-02-11 DIAGNOSIS — Z1231 Encounter for screening mammogram for malignant neoplasm of breast: Secondary | ICD-10-CM | POA: Insufficient documentation

## 2024-02-15 ENCOUNTER — Ambulatory Visit: Payer: Self-pay | Admitting: Primary Care

## 2024-02-22 ENCOUNTER — Telehealth: Payer: Self-pay

## 2024-02-22 NOTE — Telephone Encounter (Signed)
 Copied from CRM [id number]. Topic: General - Other >> Feb 22, 2024  9:03 AM Burnard DEL wrote: Reason for CRM: [facility] called stating that they received  The pre op clearance form but they need the last OV and lab notes.They have the CBC,but not the CMP labs.  Fax #[phone]

## 2024-02-22 NOTE — Telephone Encounter (Signed)
 Last OV note and labs faxed as requested.

## 2024-03-18 ENCOUNTER — Other Ambulatory Visit: Payer: Self-pay | Admitting: Family Medicine

## 2024-03-18 DIAGNOSIS — Z79890 Hormone replacement therapy (postmenopausal): Secondary | ICD-10-CM

## 2024-03-27 ENCOUNTER — Other Ambulatory Visit: Payer: Self-pay | Admitting: Primary Care

## 2024-03-27 DIAGNOSIS — M1611 Unilateral primary osteoarthritis, right hip: Secondary | ICD-10-CM

## 2024-05-14 ENCOUNTER — Other Ambulatory Visit: Payer: Self-pay | Admitting: Family Medicine

## 2024-06-11 ENCOUNTER — Other Ambulatory Visit: Payer: Self-pay | Admitting: Family Medicine

## 2024-06-11 DIAGNOSIS — Z79890 Hormone replacement therapy (postmenopausal): Secondary | ICD-10-CM

## 2024-06-27 NOTE — Telephone Encounter (Signed)
 Pt called in stating that she is out of Rx: Progesterone  (PROMETRIUM ) 100 MG.  Pharm: [facility]  Pts next appt is August 04, 2024 @ 8:55

## 2024-08-04 ENCOUNTER — Encounter: Payer: Self-pay | Admitting: Family Medicine

## 2024-08-04 ENCOUNTER — Ambulatory Visit: Admitting: Family Medicine

## 2024-08-04 VITALS — BP 122/73 | HR 88 | Ht 65.0 in | Wt 129.0 lb

## 2024-08-04 DIAGNOSIS — F419 Anxiety disorder, unspecified: Secondary | ICD-10-CM | POA: Diagnosis not present

## 2024-08-04 DIAGNOSIS — Z79890 Hormone replacement therapy (postmenopausal): Secondary | ICD-10-CM | POA: Diagnosis not present

## 2024-08-04 DIAGNOSIS — Z1231 Encounter for screening mammogram for malignant neoplasm of breast: Secondary | ICD-10-CM

## 2024-08-04 DIAGNOSIS — Z23 Encounter for immunization: Secondary | ICD-10-CM | POA: Diagnosis not present

## 2024-08-04 MED ORDER — ESTRADIOL 2 MG PO TABS: 2.0000 mg | ORAL_TABLET | Freq: Every day | ORAL | 3 refills | Status: AC

## 2024-08-04 MED ORDER — PROGESTERONE 200 MG PO CAPS: 200.0000 mg | ORAL_CAPSULE | Freq: Every day | ORAL | 3 refills | Status: AC

## 2024-08-04 MED ORDER — PROGESTERONE 200 MG PO CAPS: 200.0000 mg | ORAL_CAPSULE | Freq: Every day | ORAL | 3 refills | Status: DC

## 2024-08-04 NOTE — Progress Notes (Signed)
" ° °  Subjective:    Patient ID: Tara Parker is a 59 y.o. female presenting with Medication Refill  on 08/04/2024  HPI: Here for HRT refill. Has increased her dosing to 2mg  estradiol  daily and prometrium  up to 200 mg daily with an outside provider. Feels much better on this dose.  Review of Systems  Constitutional:  Negative for chills and fever.  Respiratory:  Negative for shortness of breath.   Cardiovascular:  Negative for chest pain.  Gastrointestinal:  Negative for abdominal pain, nausea and vomiting.  Genitourinary:  Negative for dysuria.  Skin:  Negative for rash.      Objective:    BP 122/73   Pulse 88   Ht 5' 5 (1.651 m)   Wt 129 lb (58.5 kg)   LMP 12/03/2014   BMI 21.47 kg/m  Physical Exam Exam conducted with a chaperone present.  Constitutional:      General: She is not in acute distress.    Appearance: She is well-developed.  HENT:     Head: Normocephalic and atraumatic.  Eyes:     General: No scleral icterus. Cardiovascular:     Rate and Rhythm: Normal rate.  Pulmonary:     Effort: Pulmonary effort is normal.  Abdominal:     Palpations: Abdomen is soft.  Musculoskeletal:     Cervical back: Neck supple.  Skin:    General: Skin is warm and dry.  Neurological:     Mental Status: She is alert and oriented to person, place, and time.         Assessment & Plan:   Problem List Items Addressed This Visit       Unprioritized   Hormone replacement therapy   Refilled her meds at her new dosing.      Relevant Medications   estradiol  (ESTRACE ) 2 MG tablet   progesterone  (PROMETRIUM ) 200 MG capsule   Anxiety   Improved with HRT dosing increase.      Other Visit Diagnoses       Encounter for screening mammogram for malignant neoplasm of breast    -  Primary   Relevant Orders   MM 3D SCREENING MAMMOGRAM BILATERAL BREAST     Flu vaccine need       Relevant Orders   Flu vaccine trivalent PF, 6mos and older(Flulaval,Afluria,Fluarix,Fluzone)  (Completed)        Return in about 4 months (around 12/02/2024) for repeat pap.  Tara GORMAN Birk, MD 08/04/2024 10:09 AM   "

## 2024-08-04 NOTE — Assessment & Plan Note (Signed)
 Refilled her meds at her new dosing.

## 2024-08-04 NOTE — Assessment & Plan Note (Signed)
 Improved with HRT dosing increase.
